# Patient Record
Sex: Female | Born: 1945
Health system: Southern US, Community
[De-identification: ages and names within clinical notes are randomized; demographics above are authoritative.]

## PROBLEM LIST (undated history)

## (undated) DIAGNOSIS — I10 Essential (primary) hypertension: Secondary | ICD-10-CM

## (undated) DIAGNOSIS — R51 Headache: Secondary | ICD-10-CM

## (undated) DIAGNOSIS — Z9889 Other specified postprocedural states: Secondary | ICD-10-CM

## (undated) DIAGNOSIS — S329XXA Fracture of unspecified parts of lumbosacral spine and pelvis, initial encounter for closed fracture: Secondary | ICD-10-CM

## (undated) DIAGNOSIS — K219 Gastro-esophageal reflux disease without esophagitis: Secondary | ICD-10-CM

## (undated) DIAGNOSIS — R112 Nausea with vomiting, unspecified: Secondary | ICD-10-CM

## (undated) DIAGNOSIS — C801 Malignant (primary) neoplasm, unspecified: Secondary | ICD-10-CM

## (undated) DIAGNOSIS — E7800 Pure hypercholesterolemia, unspecified: Secondary | ICD-10-CM

## (undated) DIAGNOSIS — E079 Disorder of thyroid, unspecified: Secondary | ICD-10-CM

## (undated) DIAGNOSIS — E039 Hypothyroidism, unspecified: Secondary | ICD-10-CM

## (undated) DIAGNOSIS — H269 Unspecified cataract: Secondary | ICD-10-CM

## (undated) HISTORY — DX: Essential (primary) hypertension: I10

## (undated) HISTORY — DX: Fracture of unspecified parts of lumbosacral spine and pelvis, initial encounter for closed fracture: S32.9XXA

## (undated) HISTORY — DX: Disorder of thyroid, unspecified: E07.9

## (undated) HISTORY — PX: CATARACT EXTRACTION: SUR2

## (undated) HISTORY — DX: Pure hypercholesterolemia, unspecified: E78.00

## (undated) HISTORY — DX: Gastro-esophageal reflux disease without esophagitis: K21.9

## (undated) HISTORY — PX: LAPAROSCOPIC CHOLECYSTECTOMY: SUR755

---
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
## 1998-06-25 ENCOUNTER — Emergency Department (HOSPITAL_COMMUNITY): Admission: EM | Admit: 1998-06-25 | Discharge: 1998-06-25 | Payer: Self-pay | Admitting: Emergency Medicine

## 1998-06-25 ENCOUNTER — Encounter: Payer: Self-pay | Admitting: Emergency Medicine

## 2000-01-22 ENCOUNTER — Other Ambulatory Visit: Admission: RE | Admit: 2000-01-22 | Discharge: 2000-01-22 | Payer: Self-pay | Admitting: Obstetrics and Gynecology

## 2000-01-26 ENCOUNTER — Encounter: Payer: Self-pay | Admitting: Endocrinology

## 2000-01-26 ENCOUNTER — Ambulatory Visit (HOSPITAL_COMMUNITY): Admission: RE | Admit: 2000-01-26 | Discharge: 2000-01-26 | Payer: Self-pay | Admitting: Endocrinology

## 2000-01-30 ENCOUNTER — Other Ambulatory Visit: Admission: RE | Admit: 2000-01-30 | Discharge: 2000-01-30 | Payer: Self-pay | Admitting: Obstetrics and Gynecology

## 2000-01-30 ENCOUNTER — Encounter (INDEPENDENT_AMBULATORY_CARE_PROVIDER_SITE_OTHER): Payer: Self-pay | Admitting: Specialist

## 2000-02-02 ENCOUNTER — Encounter: Payer: Self-pay | Admitting: *Deleted

## 2000-02-02 ENCOUNTER — Ambulatory Visit (HOSPITAL_COMMUNITY): Admission: RE | Admit: 2000-02-02 | Discharge: 2000-02-02 | Payer: Self-pay | Admitting: *Deleted

## 2000-02-09 ENCOUNTER — Ambulatory Visit (HOSPITAL_COMMUNITY): Admission: RE | Admit: 2000-02-09 | Discharge: 2000-02-09 | Payer: Self-pay | Admitting: *Deleted

## 2000-02-09 ENCOUNTER — Encounter: Payer: Self-pay | Admitting: *Deleted

## 2000-02-12 ENCOUNTER — Encounter: Payer: Self-pay | Admitting: *Deleted

## 2000-02-12 ENCOUNTER — Ambulatory Visit (HOSPITAL_COMMUNITY): Admission: RE | Admit: 2000-02-12 | Discharge: 2000-02-12 | Payer: Self-pay | Admitting: *Deleted

## 2000-02-21 ENCOUNTER — Encounter (INDEPENDENT_AMBULATORY_CARE_PROVIDER_SITE_OTHER): Payer: Self-pay | Admitting: Specialist

## 2000-02-21 ENCOUNTER — Observation Stay (HOSPITAL_COMMUNITY): Admission: RE | Admit: 2000-02-21 | Discharge: 2000-02-22 | Payer: Self-pay | Admitting: *Deleted

## 2000-07-15 ENCOUNTER — Ambulatory Visit (HOSPITAL_COMMUNITY): Admission: RE | Admit: 2000-07-15 | Discharge: 2000-07-15 | Payer: Self-pay | Admitting: *Deleted

## 2000-07-15 ENCOUNTER — Encounter (INDEPENDENT_AMBULATORY_CARE_PROVIDER_SITE_OTHER): Payer: Self-pay

## 2000-08-28 ENCOUNTER — Emergency Department (HOSPITAL_COMMUNITY): Admission: EM | Admit: 2000-08-28 | Discharge: 2000-08-28 | Payer: Self-pay | Admitting: *Deleted

## 2001-01-23 ENCOUNTER — Other Ambulatory Visit: Admission: RE | Admit: 2001-01-23 | Discharge: 2001-01-23 | Payer: Self-pay | Admitting: Obstetrics and Gynecology

## 2001-04-14 ENCOUNTER — Emergency Department (HOSPITAL_COMMUNITY): Admission: EM | Admit: 2001-04-14 | Discharge: 2001-04-14 | Payer: Self-pay | Admitting: Emergency Medicine

## 2001-07-21 ENCOUNTER — Encounter: Payer: Self-pay | Admitting: Endocrinology

## 2001-07-21 ENCOUNTER — Encounter: Admission: RE | Admit: 2001-07-21 | Discharge: 2001-07-21 | Payer: Self-pay | Admitting: Endocrinology

## 2003-03-05 ENCOUNTER — Emergency Department (HOSPITAL_COMMUNITY): Admission: EM | Admit: 2003-03-05 | Discharge: 2003-03-05 | Payer: Self-pay | Admitting: Emergency Medicine

## 2003-06-29 ENCOUNTER — Encounter (HOSPITAL_COMMUNITY): Admission: RE | Admit: 2003-06-29 | Discharge: 2003-09-27 | Payer: Self-pay | Admitting: Cardiology

## 2003-08-06 ENCOUNTER — Encounter: Admission: RE | Admit: 2003-08-06 | Discharge: 2003-08-06 | Payer: Self-pay | Admitting: Gastroenterology

## 2003-09-22 ENCOUNTER — Ambulatory Visit (HOSPITAL_COMMUNITY): Admission: RE | Admit: 2003-09-22 | Discharge: 2003-09-22 | Payer: Self-pay | Admitting: Gastroenterology

## 2005-08-21 ENCOUNTER — Encounter: Admission: RE | Admit: 2005-08-21 | Discharge: 2005-08-21 | Payer: Self-pay | Admitting: *Deleted

## 2005-10-12 ENCOUNTER — Encounter: Admission: RE | Admit: 2005-10-12 | Discharge: 2005-10-12 | Payer: Self-pay | Admitting: Gastroenterology

## 2005-10-17 ENCOUNTER — Ambulatory Visit (HOSPITAL_COMMUNITY): Admission: RE | Admit: 2005-10-17 | Discharge: 2005-10-17 | Payer: Self-pay | Admitting: Gastroenterology

## 2005-10-22 ENCOUNTER — Encounter: Admission: RE | Admit: 2005-10-22 | Discharge: 2005-10-22 | Payer: Self-pay | Admitting: Gastroenterology

## 2005-10-31 ENCOUNTER — Encounter: Admission: RE | Admit: 2005-10-31 | Discharge: 2005-10-31 | Payer: Self-pay | Admitting: Gastroenterology

## 2006-10-09 ENCOUNTER — Encounter: Admission: RE | Admit: 2006-10-09 | Discharge: 2006-10-09 | Payer: Self-pay | Admitting: Endocrinology

## 2007-03-27 HISTORY — PX: OTHER SURGICAL HISTORY: SHX169

## 2007-07-01 ENCOUNTER — Other Ambulatory Visit: Admission: RE | Admit: 2007-07-01 | Discharge: 2007-07-01 | Payer: Self-pay | Admitting: Internal Medicine

## 2007-07-02 ENCOUNTER — Encounter: Admission: RE | Admit: 2007-07-02 | Discharge: 2007-07-02 | Payer: Self-pay | Admitting: Endocrinology

## 2007-08-12 ENCOUNTER — Encounter: Admission: RE | Admit: 2007-08-12 | Discharge: 2007-11-10 | Payer: Self-pay | Admitting: Specialist

## 2007-11-25 ENCOUNTER — Encounter: Admission: RE | Admit: 2007-11-25 | Discharge: 2007-12-18 | Payer: Self-pay | Admitting: Specialist

## 2008-01-15 ENCOUNTER — Encounter: Admission: RE | Admit: 2008-01-15 | Discharge: 2008-02-23 | Payer: Self-pay | Admitting: Specialist

## 2008-02-26 ENCOUNTER — Encounter: Admission: RE | Admit: 2008-02-26 | Discharge: 2008-04-01 | Payer: Self-pay | Admitting: Specialist

## 2010-08-11 NOTE — Op Note (Signed)
NAME:  Shannon Gardner, Shannon Gardner                    ACCOUNT NO.:  [id number]   MEDICAL RECORD NO.:  [id number]                   PATIENT TYPE:  AMB   LOCATION:  ENDO                                 FACILITY:  [facility]   PHYSICIAN:  Anselmo Rod, M.D.               DATE OF BIRTH:  Jul 06, 1945   DATE OF PROCEDURE:  DATE OF DISCHARGE:                                 OPERATIVE REPORT   DATE OF PROCEDURE:  September 22, 2003.   PROCEDURE PERFORMED:  Screening colonoscopy.   ENDOSCOPIST:  Anselmo Rod, MD   INSTRUMENT USED:  Olympus video colonoscope.   INDICATIONS FOR PROCEDURE:  Gardner 65 year old white female with Gardner history of  abdominal pain and occasional __________ undergoing Gardner screening colonoscopy  to rule out colonic polyps, masses, etc.   PRE-PROCEDURE PREPARATION:  Informed consent was procured from the patient.  The patient was fasted for 8 hours prior to the procedure and prepped with Gardner  bottle of magnesium citrate and Gardner gallon of GoLYTELY the night prior to the  procedure.   PRE-PROCEDURE PHYSICAL:  VITAL SIGNS:  The patient had stable vital signs.  NECK:  Supple.  CHEST:  Clear to auscultation.  HEART:  S1 and S2 regular.  ABDOMEN:  Soft with normal bowel sounds.   DESCRIPTION OF PROCEDURE:  The patient was placed in the left lateral  decubitus position and sedated with 75 mg of Demerol and 5 mg of Versed  intravenously in slow incremental doses.  Once the patient was adequately  sedated and maintained on low-flow oxygen and continuous cardiac monitoring,  the Olympus video colonoscope was advanced into the right tip of the cecum.  There was some residual stool in the colon and multiple washings were done,  as well as internal hemorrhoids were seen on retroflexion in the rectum.  There was evidence of Gardner few, scattered, sigmoid diverticula.  No masses or  polyps were identified.  The procedure was completed up to the cecum, and  the appendiceal orifice and ileocecal valve were  clearly visualized  and  photographed.  The patient tolerated the procedure well without any  immediate complications.   IMPRESSION:  1. Small, nonbleeding, internal hemorrhoids.  2. Gardner few large, scattered, sigmoid diverticula.  3. No masses or polyps seen.   RECOMMENDATIONS:  1. Continue Gardner high fiber diet with liberal fluid intake.  2. Repeat colonoscopy in the next 10 years unless the patient develops any     abnormal symptoms in the interim.  3. Outpatient followup in the next 2 weeks to further work up her abdominal     pain.                                               Anselmo Rod, M.D.    JNM/MEDQ  D:  09/22/2003  T:  09/22/2003  Job:  [id number]   cc:   Cliffton Asters ???, Dr.  [facility]

## 2010-08-11 NOTE — Procedures (Signed)
[facility]  Patient:    Shannon Gardner, Shannon Gardner                 MRN: [id number] Proc. Date: 07/15/00 Adm. Date:  [id number] Attending:  Sabino Gasser                           Procedure Report  PROCEDURE:  Upper endoscopy.  INDICATION FOR PROCEDURE:  Abdominal pain.  ANESTHESIA:  Demerol 90, Versed 9 mg.  DESCRIPTION OF PROCEDURE:  With the patient mildly sedated in the left lateral decubitus position, the Olympus videoscopic endoscope was inserted in the mouth and passed under direct vision through the esophagus which showed changes of questionable Barretts photographed and biopsied. We entered into the stomach. The fundus, body, antrum, and duodenal bulb were all well visualized as was the second portion of the duodenum. Photographs taken. From this point, the endoscope was slowly withdrawn taking circumferential views of the entire duodenal mucosa until the endoscope was then pulled back into the stomach, placed in retroflexion to view the stomach from below and this too was photographed. The endoscope was then straightened and withdrawn taking circumferential views of the entire gastric mucosa stomal stopping to photograph and take CLO biopsy specimens along the way. The endoscope was withdrawn taking circumferential views of the remaining esophageal mucosa. The patients vital signs and pulse oximeter remained stable. The patient tolerated the procedure well and there were no apparent complications.  FINDINGS:  Question of Barretts esophagus, H. pylori biopsy taken as well as biopsy to rule out Barretts.  PLAN:  Have the patient call me for results of biopsy and followup with me in as an outpatient. DD:  07/15/00 TD:  07/16/00 Job: [id number]

## 2012-08-11 ENCOUNTER — Ambulatory Visit
Admission: RE | Admit: 2012-08-11 | Discharge: 2012-08-11 | Disposition: A | Discharge status: Home / Self Care | Payer: Medicare Other | Source: Ambulatory Visit | Attending: Endocrinology | Admitting: Endocrinology

## 2012-08-11 ENCOUNTER — Other Ambulatory Visit: Payer: Self-pay | Admitting: Endocrinology

## 2012-08-11 DIAGNOSIS — M79604 Pain in right leg: Secondary | ICD-10-CM

## 2012-10-30 ENCOUNTER — Other Ambulatory Visit: Payer: Self-pay | Admitting: Gastroenterology

## 2012-10-30 DIAGNOSIS — R1013 Epigastric pain: Secondary | ICD-10-CM

## 2012-10-30 DIAGNOSIS — R1033 Periumbilical pain: Secondary | ICD-10-CM

## 2012-11-04 ENCOUNTER — Ambulatory Visit
Admission: RE | Admit: 2012-11-04 | Discharge: 2012-11-04 | Disposition: A | Discharge status: Home / Self Care | Payer: Medicare Other | Source: Ambulatory Visit | Attending: Gastroenterology | Admitting: Gastroenterology

## 2012-11-04 DIAGNOSIS — R1033 Periumbilical pain: Secondary | ICD-10-CM

## 2012-11-04 DIAGNOSIS — R1013 Epigastric pain: Secondary | ICD-10-CM

## 2012-11-04 MED ORDER — IOHEXOL 300 MG/ML  SOLN
125.0000 mL | Freq: Once | INTRAMUSCULAR | Status: AC | PRN
  Administered 2012-11-04: 125 mL via INTRAVENOUS

## 2012-11-21 ENCOUNTER — Other Ambulatory Visit (HOSPITAL_COMMUNITY): Payer: Self-pay | Admitting: Urology

## 2012-11-21 DIAGNOSIS — R310 Gross hematuria: Secondary | ICD-10-CM

## 2012-12-08 ENCOUNTER — Ambulatory Visit (HOSPITAL_COMMUNITY): Admission: RE | Admit: 2012-12-08 | Payer: Medicare Other | Source: Ambulatory Visit

## 2012-12-08 ENCOUNTER — Ambulatory Visit (HOSPITAL_COMMUNITY)
Admission: RE | Admit: 2012-12-08 | Discharge: 2012-12-08 | Disposition: A | Discharge status: Home / Self Care | Payer: Medicare Other | Source: Ambulatory Visit | Attending: Urology | Admitting: Urology

## 2012-12-08 DIAGNOSIS — R310 Gross hematuria: Secondary | ICD-10-CM

## 2012-12-08 DIAGNOSIS — N281 Cyst of kidney, acquired: Secondary | ICD-10-CM | POA: Insufficient documentation

## 2012-12-08 DIAGNOSIS — R948 Abnormal results of function studies of other organs and systems: Secondary | ICD-10-CM | POA: Insufficient documentation

## 2012-12-08 MED ORDER — GADOBENATE DIMEGLUMINE 529 MG/ML IV SOLN
20.0000 mL | Freq: Once | INTRAVENOUS | Status: AC | PRN
  Administered 2012-12-08: 20 mL via INTRAVENOUS

## 2012-12-30 ENCOUNTER — Ambulatory Visit: Payer: Medicare Other | Attending: Gynecologic Oncology | Admitting: Gynecologic Oncology

## 2012-12-30 ENCOUNTER — Encounter: Payer: Self-pay | Admitting: Gynecologic Oncology

## 2012-12-30 VITALS — BP 150/77 | HR 66 | Temp 97.8°F | Resp 18 | Ht 65.16 in | Wt 291.4 lb

## 2012-12-30 DIAGNOSIS — C541 Malignant neoplasm of endometrium: Secondary | ICD-10-CM | POA: Insufficient documentation

## 2012-12-30 DIAGNOSIS — E669 Obesity, unspecified: Secondary | ICD-10-CM | POA: Insufficient documentation

## 2012-12-30 DIAGNOSIS — C549 Malignant neoplasm of corpus uteri, unspecified: Secondary | ICD-10-CM | POA: Insufficient documentation

## 2012-12-30 DIAGNOSIS — Z6841 Body Mass Index (BMI) 40.0 and over, adult: Secondary | ICD-10-CM | POA: Insufficient documentation

## 2012-12-30 DIAGNOSIS — E079 Disorder of thyroid, unspecified: Secondary | ICD-10-CM | POA: Insufficient documentation

## 2012-12-30 DIAGNOSIS — I10 Essential (primary) hypertension: Secondary | ICD-10-CM | POA: Insufficient documentation

## 2012-12-30 DIAGNOSIS — K219 Gastro-esophageal reflux disease without esophagitis: Secondary | ICD-10-CM | POA: Insufficient documentation

## 2012-12-30 DIAGNOSIS — Z79899 Other long term (current) drug therapy: Secondary | ICD-10-CM | POA: Insufficient documentation

## 2012-12-30 NOTE — Progress Notes (Signed)
Consult Note: Gyn-Onc   Consult was requested by Dr. LoVoie for the evaluation of Shannon Gardner 66 y.o. female  CC:  Chief Complaint  Patient presents with  . Endo CA    New Consult    Assessment/Plan:  [name]  is a 66 y.o.  year old with a FIGO grade 1 endometrial adenocarcinoma and a BMI of approximately 48.  Uterine size could not be assessed on physical examination or on the imaging available.  The staging options discussed with the patient were minimal or open hysterectomy bilateral salpingo-oophorectomy and lymph node dissection.  On physical examination she lays well and significant amount of her obesity is in the buttocks and back.  I discussed the benefits of minimally invasive endometrial cancer staging the patient and her husband. I extensively reviewed the risks of robotic hysterectomy and possible lymph node dissection of infection, bleeding, damage to nearby organs including bowel, bladder, vessels, nerves, and ureters. We discussed postoperative risks including infection, PE/ DVT, and lymphedema. We reviewed possible need for conversion to open laparotomy, need for possible blood transfusion. I discussed positioning during surgery of trendelenberg and risks of minor facial swelling and care we take in preoperative positioning. We also reviewed possible need for further treatment such as radiation or chemotherapy based on final pathology. Expected postoperative recovery was also discussed. The plan is for robotic assisted laparoscopic hysterectomy bilateral salpingo-oophorectomy possible pelvic lymph node dissection on 01/20/2013 with Dr. Paola Gehrig.  All of her questions were answered her satisfaction   HPI: [name]  is a 66 y.o. gravida 4 para 4 last normal menstrual period in her early 50s. The patient presented with reports of hematuria specifically bloody mucous discharge within her urine over the last 9 months. A workup was undertaken  and that included an MRI which demonstrated a thickened irregular endometrium in the fundus measuring up to 2 cm diverticulosis of the colon was noted the adnexa appeared normal. The rectal remnant was noted that there were no findings of Eure, bladder cancer. Abdominal MRI was unremarkable and a CT scan did not add anything additional. She underwent cystoscopy and that was within normal limits. Underwent an endometrial biopsy on 12/18/2012 which demonstrated a FIGO grade 1 endometrial adenocarcinoma. His Pap negative HPV negative. A colonoscopy on August of 2014 was notable for the presence of a benign polyp.  The patient feels well denies any weight loss denies any changes in her bladder or rectal habits reports occasional bleeding denies any shortness of breath.   Current Meds:  Outpatient Encounter Prescriptions as of 12/30/2012  Medication Sig Dispense Refill  . acetaminophen (TYLENOL) 325 MG tablet Take 650 mg by mouth every 6 (six) hours as needed for pain.      . aspirin 81 MG tablet Take 81 mg by mouth daily.      . BIOGAIA PROBIOTIC (BIOGAIA PROBIOTIC) LIQD Take by mouth daily at 8 pm. OTC probiotic taken " at night"  Align.      . ezetimibe (ZETIA) 10 MG tablet Take 10 mg by mouth at bedtime.      . ibuprofen (ADVIL,MOTRIN) 200 MG tablet Take 100 mg by mouth every 6 (six) hours as needed for pain.      . levothyroxine (SYNTHROID, LEVOTHROID) 112 MCG tablet Take 112 mcg by mouth daily before breakfast.      . losartan-hydrochlorothiazide (HYZAAR) 50-12.5 MG per tablet Take 1 tablet by mouth 2 (two) times daily. One tablet in am,   and one tablet in pm.      . omeprazole (PRILOSEC) 40 MG capsule Take 40 mg by mouth 2 (two) times daily. Am and pm      . rosuvastatin (CRESTOR) 5 MG tablet Take 2.5 mg by mouth every other day.       No facility-administered encounter medications on file as of 12/30/2012.    Allergy:  Allergies  Allergen Reactions  . Altace [Ramipril]     Social Hx:    History   Social History  . Marital Status: Married    Spouse Name: N/A    Number of Children: N/A  . Years of Education: N/A   Occupational History  . Not on file.   Social History Main Topics  . Smoking status: Former Smoker    Start date: 03/26/1974    Quit date: 03/26/1988  . Smokeless tobacco: Not on file  . Alcohol Use: 0.0 oz/week     Comment: " 3-4 times a year"   . Drug Use: No  . Sexual Activity: Not Currently   Other Topics Concern  . Not on file   Social History Narrative  . No narrative on file    Past Surgical Hx:  Past Surgical History  Procedure Laterality Date  . Laparoscopic cholecystectomy  2000?  . Left knee surg  2009    "Torn meniscus"   . Tonsilectomy, adenoidectomy, bilateral myringotomy and tubes  1955    Past Medical Hx:  Past Medical History  Diagnosis Date  . Hypertension   . Thyroid disease   . GERD (gastroesophageal reflux disease)   . Blood transfusion without reported diagnosis    diverticulosis  Past Gynecological History: Gravida 4 para 4 last normal menstrual period in her early 50s menarche at age 11 with regular menses reports oral contraceptive pill use intermittently over 10 years   No LMP recorded. Patient is postmenopausal. Denies any history of abnormal Pap test NSVD x4  Family Hx:  Family History  Problem Relation Age of Onset  . Cancer Mother   . Cancer Sister   . Cancer Brother    her mother was diagnosed leiomyosarcoma presumed of lung origin at age of 68. Sister diagnosed with lung cancer the age of 47 brother diagnosed with prostate cancer in his 50s  Review of Systems:  Constitutional  Feels well,  denies recent weight loss Cardiovascular  No chest pain, shortness of breath, or edema  Pulmonary  No cough or wheeze.  Gastro Intestinal  No nausea, vomitting, or diarrhoea. No bright red blood per rectum, no abdominal pain, change in bowel movement, or constipation.  Genito Urinary  No frequency,  urgency, dysuria, and omentum vaginal bleeding no vaginal discharge  Musculo Skeletal  No myalgia, arthralgia, occasional arthritic pain Neurologic  No weakness, numbness, change in gait,  Psychology  No depression, anxiety, insomnia.   Vitals:  Blood pressure 150/77, pulse 66, temperature 97.8 F (36.6 C), temperature source Oral, resp. rate 18, height 5' 5.16" (1.655 m), weight 291 lb 6.4 oz (132.178 kg). Body mass index is 48.26 kg/(m^2).  Physical Exam: WD in NAD Neck  Supple NROM, without any enlargements.  Lymph Node Survey No cervical supraclavicular or inguinal adenopathy Cardiovascular  Pulse normal rate, regularity and rhythm. S1 and S2 normal.  Lungs  Clear to auscultation bilateraly, without wheezes/crackles/rhonchi. Good air movement.  Psychiatry  Alert and oriented to person, place, and time appropriate speech mood and affect Abdomen  Normoactive bowel sounds, abdomen soft, non-tender and obese.    Back No CVA tenderness Genito Urinary  Vulva/vagina: Normal external female genitalia.  No lesions. No discharge or bleeding.  Bladder/urethra:  No lesions or masses  Vagina: Atrophic no blood or discharge in the vault  Cervix: Normal appearing, no lesions. Cervical size approximately 3 cm  Uterus: Well supported  mobile, no parametrial involvement or nodularity. Unable to assess uterine size because of body habitus  Adnexa: unable to assess Rectal  Good tone, no masses no cul de sac nodularity.  Extremities  No bilateral cyanosis, clubbing or edema.   Jaclyn Andy, MD, PhD 12/30/2012, 1:10 PM    

## 2012-12-30 NOTE — Patient Instructions (Signed)
Will perform robotic hysterectomy and lymph nodes with Dr. Cleda Mccreedy on 01/20/2013.  Cancer of the Uterus The uterus is part of a woman's reproductive system. It is the hollow, pear-shaped organ where a baby grows. The uterus is in the pelvis between the bladder and the rectum. The narrow, lower portion of the uterus is the cervix. The fallopian tubes extend from either side of the top of the uterus to the ovaries. The wall of the uterus has two layers of tissue. The inner layer, or lining, is the endometrium. The outer layer is muscle tissue called the myometrium. In women of childbearing age, the lining of the uterus grows and thickens each month to prepare for pregnancy. If a woman does not become pregnant, the thick, bloody lining flows out of the body through the vagina. This flow is called menstruation. TYPES OF UTERINE CANCER  The most common type of cancer of the uterus begins in the lining (endometrium). It is called endometrial cancer, uterine cancer, or cancer of the uterus. It is seen in 2% to 3% of women.  A different type of cancer, uterine sarcoma, develops in the muscle (myometrium). Cancer that begins in the cervix is also a different type of cancer.  Rarely, a noncancerous fibroid tumor of the uterus develops into a sarcoma. CAUSES  No one knows the exact causes of uterine cancer. But it is clear that this disease is not contagious. No one can "catch" cancer from another person. Women who get this disease are more likely than other women to have certain risk factors. A risk factor is something that increases a person's chance of developing the disease.  Most women who have known risk factors do not get uterine cancer. On the other hand, many who do get this disease have none of these factors. Doctors can seldom explain why one woman gets uterine cancer and another does not.  Studies have found the following risk factors:  Age. Cancer of the uterus occurs mostly in women over  age 32.  Endometrial hyperplasia (enlarged endometrium). The risk of uterine cancer is higher if a woman has endometrial hyperplasia.  Hormone replacement therapy (HRT). HRT is used to control the symptoms of menopause, to prevent osteoporosis (thinning of the bones), and to reduce the risk of heart disease or stroke. Women who still have their uterus, and use estrogen without progesterone, have an increased risk of uterine cancer. Long-term use and large doses of estrogen seem to increase this risk. Women who use a combination of estrogen and progesterone have a lower risk of uterine cancer than women who use estrogen alone. The progesterone protects the uterus from developing cancer.  Obesity and related conditions. The body stores and releases some of its estrogen in fatty tissue. That is why obese women are more likely than thin women to have higher levels of estrogen in their bodies. High levels of estrogen may be the reason that obese women have an increased risk of developing uterine cancer. The risk of this disease is also higher in women with diabetes or high blood pressure. These conditions occur in many obese women.  Tamoxifen. Women taking the drug tamoxifen to prevent or treat breast cancer have an increased risk of uterine cancer. This risk appears to be related to the estrogen-like effect of this drug on the uterus.  Race. White women are more likely than African-American women to get uterine cancer.  Colorectal cancer. Women who have had an inherited form of colorectal cancer have a  higher risk of developing uterine cancer than other women.  Infertility.  Beginning menstrual periods before age 38.  Having menstrual periods after age 41.  History of cancer of the ovary or intestine.  Family history of uterine cancer.  Having diabetes, high blood pressure, thyroid or gallbladder disease.  Long-term use of high does of birth control pills. Birth control pills today are low in  hormone doses.  Radiation to the abdomen or pelvis.  Smoking. SYMPTOMS  Uterine cancer usually occurs after menopause. But it may also occur around the time that menopause begins. Abnormal vaginal bleeding is the most common symptom of uterine cancer. Bleeding may start as a watery, blood-streaked flow that gradually contains more blood. Women should not assume that abnormal vaginal bleeding is part of menopause. A woman should see her caregiver if she has any of the following symptoms:  Unusual vaginal bleeding or discharge.  Difficult or painful urination.  Pain during intercourse.  Pain in the pelvic area.  Increased girth (growth) of the stomach.  Any vaginal bleeding after menopause.  Unexplained weight loss. These symptoms can be caused by cancer or other less serious conditions. Most often they are not cancer. But a thorough evaluation is needed to be certain. DIAGNOSIS  If a woman has symptoms that suggest uterine cancer, her caregiver may check her general health and may order blood and urine tests. The caregiver also may perform one or more of these exams or tests.  Blood and urine tests and chest x-rays. The woman also may have:  Other X-rays.  CT scans.  Ultrasound test.  Magnetic resonance imaging (MRI).  Sigmoidoscopy.  Colonoscopy.  Pelvic exam. A woman will have a pelvic exam to check the vagina, uterus, bladder, and rectum. The caregiver feels these organs for any lumps or changes in their shape or size. To see the upper part of the vagina and the cervix, the caregiver inserts an instrument called a speculum into the vagina.  Pap test. The caregiver collects cells from the cervix and upper vagina. A medical laboratory checks for abnormal cells. The Pap test is better for detecting cancer of the cervix. But cells from inside the uterus usually do not show up on a Pap test. It is not a reliable test for uterine cancer.  Transvaginal ultrasound. The medical  caregiver inserts an instrument into the vagina. The instrument aims high-frequency sound waves at the uterus. The pattern of the echoes they produce creates a picture. If the endometrium looks too thick, the caregiver can do a biopsy.  Biopsy. The medical caregiver removes a sample of tissue from the uterine lining. This usually can be done in the caregiver's office.  Dilatation and Curettage (D&C). In some cases, a woman may need to have a D&C. D&C is usually done as same-day surgery with anesthesia in a hospital. A pathologist examines the tissue (lining of the uterus) to check for cancer cells and other conditions. STAGING   If uterine cancer is diagnosed, the caregiver needs to know the stage, or extent, of the disease to plan the best treatment. Staging is a careful attempt to find out whether the cancer has spread, and if so, to what parts of the body.  When uterine cancer spreads (metastasizes) outside the uterus, cancer cells are often found in nearby lymph nodes, nerves, or blood vessels. If the cancer has reached the lymph nodes, cancer cells may have spread to other lymph nodes and other organs of the body.  Staging is done  at the time of surgery. In most cases, the most reliable way to stage this disease is to remove the uterus, cervix, tubes, ovaries, and lymph nodes. A pathologist uses a microscope to examine the uterus and other tissues removed by the surgeon, to determine the extent of the cancer in the pelvis.  If lymph nodes have cancer cells, other parts of the body are examined, to see if it has spread to other organs. MAIN FEATURES OF EACH STAGE OF THE DISEASE: Stage I. The cancer is only in the body of the uterus. It is not in the cervix. Stage II. The cancer has spread from the body of the uterus to the cervix. Stage III. The cancer has spread outside the uterus, but not outside the pelvis (and not to the bladder or rectum). Lymph nodes in the pelvis may contain cancer  cells. Stage IV. The cancer has spread into the bladder or rectum. It may have spread beyond the pelvis to other body parts. TREATMENT  Women with uterine cancer have many treatment options. Most women with uterine cancer are treated with surgery. Some have radiation or chemotherapy. A smaller number of women may be treated with hormonal therapy. Some patients receive a combination of therapies. You may want to consult with another cancer doctor for a second opinion. The caregiver (usually a cancer doctor) is the best person to describe your treatment choices and to discuss the expected results of treatment. SURGERY  Most women with uterine cancer have surgery to remove the uterus, cervix, tubes, and ovaries (total hysterectomy). This is usually done through an incision in the abdomen.  The doctor may also remove the lymph nodes near the tumor, to see if they contain cancer. If cancer cells have reached the lymph nodes, it may mean that the disease has spread to other parts of the body. If cancer cells have not spread beyond the endometrium, the woman may not need to have any other treatment. The length of the hospital stay may vary from several days to a week. RADIATION THERAPY  In radiation therapy, high-energy rays are used to kill cancer cells. Like surgery, radiation therapy is a local therapy. It affects cancer cells only in the treated area.  Some women with Stage I, II, or III uterine cancer need both radiation therapy and surgery. They may have radiation before surgery to shrink the tumor, or after surgery to destroy any cancer cells that remain in the area. The doctor may suggest radiation treatments for the small number of women who cannot have surgery.  Doctors use two types of radiation therapy to treat uterine cancer:  External radiation. In external radiation therapy, a large machine outside the body is used to aim radiation at the tumor area. The woman usually does not stay overnight  (outpatient) at the hospital or clinic, and receives external radiation 5 days a week for several weeks. This schedule helps protect healthy cells and tissue by spreading out the total dose of radiation. No radioactive materials are put into the body for external radiation therapy.  Internal radiation. In internal radiation therapy, tiny tubes containing a radioactive substance are inserted through the vagina and cervix, into the uterus, and left in place for a few days. The woman stays in the hospital during this treatment. To protect others from radiation exposure, the patient may not be able to have visitors or may have visitors only for a short period of time while the implant is in place. Once the implant is  removed, the woman has no radioactivity in her body.  Some patients need both external and internal radiation therapies. CHEMOTHERAPY Chemotherapy is not usually used for endometrial cancer of the uterus. However, with sarcoma of the uterus or of the fibroid, it may be used in combination with surgery. Chemotherapy may also be used with recurring sarcoma, and in patients who cannot have surgery. HORMONE THERAPY Hormonal therapy involves substances that prevent cancer cells from multiplying or growing by attaching to hormone receptors. This causes changes in cancer cells. Before therapy begins, the caregiver may request a hormone receptor test. This special lab test of uterine tissue helps the caregiver learn if estrogen and progesterone receptors are present. If the tissue has receptors, the woman is more likely to respond to hormonal therapy.  Hormonal therapy is called a systemic therapy, because it can affect cancer cells throughout the body. Usually, hormonal therapy is a type of progesterone, taken as a pill or injection.  The doctor may use hormonal therapy for women with uterine cancer who are unable to have surgery or radiation therapy. Also, the doctor may give hormonal therapy to women  with uterine cancer that has spread to the lungs or other distant sites. It is also given to women with uterine cancer that has come back.  Hormonal therapy can cause a number of side effects. Women taking progesterone may retain fluid, have an increased appetite, and gain weight. Women who are still menstruating may have changes in their periods.  Hormone therapy can be used in combination with surgery or radiation. HOME CARE INSTRUCTIONS   Maintain a normal weight with a healthy balanced diet and exercise.  If you have diabetes, high blood pressure, thyroid or gallbladder disease, keep them in control with your caregiver's treatment and recommendations.  Do not smoke.  Do not take estrogen without taking progesterone with it, for menopausal symptoms.  Join a support group or get counseling, if you would like help dealing with your cancer.  If you are on hormone replacement therapy, see your caregiver as recommended, and be informed about the side effects of HRT.  Women with known risk factors should ask their caregiver what symptoms to look for and how often they should have an examination.  Keep your follow-up appointments and take your medicines as advised.  Write your questions down, and take them with you to your caregiver's appointments.  You may want another person to be with you for your appointments, so you do not miss any instructions. SEEK MEDICAL CARE IF:   You have any abnormal vaginal bleeding.  You are having menstrual periods at the age of 13 or older.  You have bleeding after sexual intercourse.  You are taking tomoxifen and develop vaginal bleeding.  Your stomach is growing, and you are not pregnant.  You have pain with sexual intercourse.  You have stomach or pelvis pain.  You have weight loss for no known reason.  You have pain or difficulty with urination. [facility] B[facility]  Cancer Information Service (CIS) provides accurate,  up-to-date information on cancer to patients and their families, health professionals, and the general public:  Phone: 1-800-4-CANCER (520-549-7195).  Internet: http://www.cancer.gov NCI's website contains complete information about cancer causes and prevention, screening and diagnosis, treatment and survivorship, clinical trials, statistics, funding, training, and employment opportunities, and [employer] and its programs. CLINICAL TRIALS A woman who is interested in being part of a clinical trial should talk with her caregiver. NCI's website (http://www.johnson-fowler.biz/) provides general information  about clinical trials. It also offers detailed information about specific ongoing studies of uterine cancer by linking to PDQ, a cancer information database developed by the NCI. The Cancer Information Service at 1-800-4-CANCER can answer questions about cancer and provide information from the PDQ database. Document Released: 03/12/2005 Document Revised: 06/04/2011 Document Reviewed: 01/13/2009 [facility] Patient Information 2014 [place], [place].

## 2013-01-13 ENCOUNTER — Encounter (HOSPITAL_COMMUNITY): Payer: Self-pay | Admitting: Pharmacy Technician

## 2013-01-16 ENCOUNTER — Other Ambulatory Visit: Payer: Self-pay

## 2013-01-16 ENCOUNTER — Other Ambulatory Visit: Payer: Self-pay | Admitting: Gynecologic Oncology

## 2013-01-16 ENCOUNTER — Encounter (HOSPITAL_COMMUNITY): Payer: Self-pay

## 2013-01-16 ENCOUNTER — Telehealth: Payer: Self-pay | Admitting: *Deleted

## 2013-01-16 ENCOUNTER — Encounter (HOSPITAL_COMMUNITY)
Admission: RE | Admit: 2013-01-16 | Discharge: 2013-01-16 | Disposition: A | Discharge status: Home / Self Care | Payer: Medicare Other | Source: Ambulatory Visit | Attending: Obstetrics & Gynecology | Admitting: Obstetrics & Gynecology

## 2013-01-16 DIAGNOSIS — Z01812 Encounter for preprocedural laboratory examination: Secondary | ICD-10-CM | POA: Insufficient documentation

## 2013-01-16 DIAGNOSIS — Z01810 Encounter for preprocedural cardiovascular examination: Secondary | ICD-10-CM | POA: Insufficient documentation

## 2013-01-16 DIAGNOSIS — Z01818 Encounter for other preprocedural examination: Secondary | ICD-10-CM | POA: Insufficient documentation

## 2013-01-16 DIAGNOSIS — E876 Hypokalemia: Secondary | ICD-10-CM

## 2013-01-16 HISTORY — DX: Other specified postprocedural states: R11.2

## 2013-01-16 HISTORY — DX: Headache: R51

## 2013-01-16 HISTORY — DX: Hypothyroidism, unspecified: E03.9

## 2013-01-16 HISTORY — DX: Other specified postprocedural states: Z98.890

## 2013-01-16 HISTORY — DX: Unspecified cataract: H26.9

## 2013-01-16 LAB — COMPREHENSIVE METABOLIC PANEL
ALT: 22 U/L (ref 0–35)
AST: 26 U/L (ref 0–37)
Albumin: 3.7 g/dL (ref 3.5–5.2)
Alkaline Phosphatase: 112 U/L (ref 39–117)
BUN: 18 mg/dL (ref 6–23)
CO2: 27 mEq/L (ref 19–32)
Calcium: 9.9 mg/dL (ref 8.4–10.5)
Chloride: 96 mEq/L (ref 96–112)
Creatinine, Ser: 0.86 mg/dL (ref 0.50–1.10)
GFR calc Af Amer: 79 mL/min — ABNORMAL LOW (ref >90)
GFR calc non Af Amer: 68 mL/min — ABNORMAL LOW (ref >90)
Glucose, Bld: 96 mg/dL (ref 70–99)
Potassium: 3.3 mEq/L — ABNORMAL LOW (ref 3.5–5.1)
Sodium: 134 mEq/L — ABNORMAL LOW (ref 135–145)
Total Bilirubin: 0.6 mg/dL (ref 0.3–1.2)
Total Protein: 7.7 g/dL (ref 6.0–8.3)

## 2013-01-16 LAB — CBC WITH DIFFERENTIAL/PLATELET
Basophils Absolute: 0.1 10*3/uL (ref 0.0–0.1)
Basophils Relative: 1 % (ref 0–1)
Eosinophils Absolute: 0.2 10*3/uL (ref 0.0–0.7)
Eosinophils Relative: 3 % (ref 0–5)
HCT: 44.3 % (ref 36.0–46.0)
Hemoglobin: 14.7 g/dL (ref 12.0–15.0)
Lymphocytes Relative: 27 % (ref 12–46)
Lymphs Abs: 1.9 10*3/uL (ref 0.7–4.0)
MCH: 29.8 pg (ref 26.0–34.0)
MCHC: 33.2 g/dL (ref 30.0–36.0)
MCV: 89.7 fL (ref 78.0–100.0)
Monocytes Absolute: 0.5 10*3/uL (ref 0.1–1.0)
Monocytes Relative: 7 % (ref 3–12)
Neutro Abs: 4.4 10*3/uL (ref 1.7–7.7)
Neutrophils Relative %: 62 % (ref 43–77)
Platelets: 255 10*3/uL (ref 150–400)
RBC: 4.94 MIL/uL (ref 3.87–5.11)
RDW: 13.8 % (ref 11.5–15.5)
WBC: 7.1 10*3/uL (ref 4.0–10.5)

## 2013-01-16 LAB — URINALYSIS, ROUTINE W REFLEX MICROSCOPIC
Bilirubin Urine: NEGATIVE
Glucose, UA: NEGATIVE mg/dL
Hgb urine dipstick: NEGATIVE
Ketones, ur: NEGATIVE mg/dL
Leukocytes, UA: NEGATIVE
Nitrite: NEGATIVE
Protein, ur: NEGATIVE mg/dL
Specific Gravity, Urine: 1.02 (ref 1.005–1.030)
Urobilinogen, UA: 0.2 mg/dL (ref 0.0–1.0)
pH: 6 (ref 5.0–8.0)

## 2013-01-16 MED ORDER — POTASSIUM CHLORIDE CRYS ER 20 MEQ PO TBCR: 10.0000 meq | EXTENDED_RELEASE_TABLET | Freq: Two times a day (BID) | ORAL | Status: DC

## 2013-01-16 NOTE — Progress Notes (Signed)
Patient notified of potassium level of 3.3 by Earlean Polka RN. Informed that six doses of kdur 10 meq to be taken twice daily would be sent to her pharmacy.

## 2013-01-16 NOTE — Pre-Procedure Instructions (Signed)
01-16-13 EKG done today. CXR 10-20-12-report with chart.

## 2013-01-16 NOTE — Telephone Encounter (Signed)
Spoke with patient about Potassium result of 3.3.  Notified patient that per [facility], ANP, prescription for potassium will be called in to pharmacy.

## 2013-01-16 NOTE — Patient Instructions (Addendum)
[address] [name]  01/16/2013   Your procedure is scheduled on: 10-28  -2014  Report to [facility] at        0515 AM.  Call this number if you have problems the morning of surgery: [phone]  Or Presurgical Testing [phone]([place] Hoefle)   Remember: Follow any bowel prep instructions per MD office.    Do not eat food:After Midnight.    Take these medicines the morning of surgery with A SIP OF WATER: Synthroid. Omeprazole.  Tylenol(if needed).   Do not wear jewelry, make-up or nail polish.  Do not wear lotions, powders, or perfumes. You may wear deodorant.  Do not shave 12 hours prior to first CHG shower(legs and under arms).(face and neck okay.)  Do not bring valuables to the hospital.  Contacts, dentures or removable bridgework, body piercing, hair pins may not be worn into surgery.  Leave suitcase in the car. After surgery it may be brought to your room.  For patients admitted to the hospital, checkout time is 11:00 AM the day of discharge.   Patients discharged the day of surgery will not be allowed to drive home. Must have responsible person with you x 24 hours once discharged.  Name and phone number of your driver: Mickey Farber [phone] /[phone]  cell Special Instructions: CHG(Chlorhedine 4%-"Hibiclens","Betasept","Aplicare") Shower Use Special Wash: see special instructions.(avoid face and genitals)   Please read over the following fact sheets that you were given: Blood Transfusion fact sheet, Incentive Spirometry Instruction.  Remember : Type/Screen "Blue armbands" - may not be removed once applied(would result in being retested if removed).  Failure to follow these instructions may result in Cancellation of your surgery.   Patient signature_______________________________________________________

## 2013-01-19 ENCOUNTER — Telehealth: Payer: Self-pay | Admitting: Gynecologic Oncology

## 2013-01-19 MED ORDER — DEXTROSE 5 % IV SOLN
3.0000 g | INTRAVENOUS | Status: AC
  Administered 2013-01-20: 3 g via INTRAVENOUS
  Filled 2013-01-19: qty 3000

## 2013-01-19 NOTE — Telephone Encounter (Signed)
Telephone call to check on pre-operative status.  Patient complaint with pre-operative instructions.  Reinforced NPO after midnight.  No questions or concerns voiced.  Instructed to call for any needs. 

## 2013-01-20 ENCOUNTER — Encounter (HOSPITAL_COMMUNITY): Payer: Self-pay | Admitting: *Deleted

## 2013-01-20 ENCOUNTER — Encounter (HOSPITAL_COMMUNITY)
Admission: RE | Disposition: A | Discharge status: Home / Self Care | Payer: Self-pay | Source: Ambulatory Visit | Attending: Obstetrics & Gynecology

## 2013-01-20 ENCOUNTER — Inpatient Hospital Stay (HOSPITAL_COMMUNITY): Payer: Medicare Other | Admitting: Certified Registered Nurse Anesthetist

## 2013-01-20 ENCOUNTER — Inpatient Hospital Stay (HOSPITAL_COMMUNITY)
Admission: RE | Admit: 2013-01-20 | Discharge: 2013-01-21 | DRG: 740 | Disposition: A | Discharge status: Home / Self Care | Payer: Medicare Other | Source: Ambulatory Visit | Attending: Obstetrics & Gynecology | Admitting: Obstetrics & Gynecology

## 2013-01-20 ENCOUNTER — Encounter (HOSPITAL_COMMUNITY): Payer: Medicare Other | Admitting: Certified Registered Nurse Anesthetist

## 2013-01-20 DIAGNOSIS — Z7982 Long term (current) use of aspirin: Secondary | ICD-10-CM

## 2013-01-20 DIAGNOSIS — E669 Obesity, unspecified: Secondary | ICD-10-CM | POA: Diagnosis present

## 2013-01-20 DIAGNOSIS — C549 Malignant neoplasm of corpus uteri, unspecified: Principal | ICD-10-CM | POA: Diagnosis present

## 2013-01-20 DIAGNOSIS — R319 Hematuria, unspecified: Secondary | ICD-10-CM | POA: Diagnosis present

## 2013-01-20 DIAGNOSIS — Z87891 Personal history of nicotine dependence: Secondary | ICD-10-CM

## 2013-01-20 DIAGNOSIS — Z01810 Encounter for preprocedural cardiovascular examination: Secondary | ICD-10-CM

## 2013-01-20 DIAGNOSIS — I10 Essential (primary) hypertension: Secondary | ICD-10-CM | POA: Diagnosis present

## 2013-01-20 DIAGNOSIS — Z79899 Other long term (current) drug therapy: Secondary | ICD-10-CM

## 2013-01-20 DIAGNOSIS — Z6841 Body Mass Index (BMI) 40.0 and over, adult: Secondary | ICD-10-CM

## 2013-01-20 DIAGNOSIS — K219 Gastro-esophageal reflux disease without esophagitis: Secondary | ICD-10-CM | POA: Diagnosis present

## 2013-01-20 DIAGNOSIS — Z801 Family history of malignant neoplasm of trachea, bronchus and lung: Secondary | ICD-10-CM

## 2013-01-20 DIAGNOSIS — C541 Malignant neoplasm of endometrium: Secondary | ICD-10-CM | POA: Diagnosis present

## 2013-01-20 DIAGNOSIS — Z01812 Encounter for preprocedural laboratory examination: Secondary | ICD-10-CM

## 2013-01-20 DIAGNOSIS — Z791 Long term (current) use of non-steroidal anti-inflammatories (NSAID): Secondary | ICD-10-CM

## 2013-01-20 HISTORY — PX: ROBOTIC ASSISTED TOTAL HYSTERECTOMY WITH BILATERAL SALPINGO OOPHERECTOMY: SHX6086

## 2013-01-20 LAB — TYPE AND SCREEN
ABO/RH(D): O POS
Antibody Screen: NEGATIVE

## 2013-01-20 LAB — ABO/RH: ABO/RH(D): O POS

## 2013-01-20 SURGERY — ROBOTIC ASSISTED TOTAL HYSTERECTOMY WITH BILATERAL SALPINGO OOPHORECTOMY
Anesthesia: General | Laterality: Bilateral | Wound class: Clean Contaminated

## 2013-01-20 MED ORDER — HYDROMORPHONE HCL PF 1 MG/ML IJ SOLN
0.2500 mg | INTRAMUSCULAR | Status: DC | PRN
  Administered 2013-01-20: 0.25 mg via INTRAVENOUS

## 2013-01-20 MED ORDER — LACTATED RINGERS IV SOLN
INTRAVENOUS | Status: DC | PRN
  Administered 2013-01-20 (×2): via INTRAVENOUS

## 2013-01-20 MED ORDER — STERILE WATER FOR IRRIGATION IR SOLN
Status: DC | PRN
  Administered 2013-01-20: 3000 mL

## 2013-01-20 MED ORDER — SODIUM CHLORIDE 0.9 % IJ SOLN
INTRAMUSCULAR | Status: AC
  Administered 2013-01-20: 3 mL
  Filled 2013-01-20: qty 3

## 2013-01-20 MED ORDER — HYDROMORPHONE HCL PF 1 MG/ML IJ SOLN
0.5000 mg | INTRAMUSCULAR | Status: DC | PRN
  Administered 2013-01-20 (×2): 0.5 mg via INTRAVENOUS
  Filled 2013-01-20 (×2): qty 1

## 2013-01-20 MED ORDER — IBUPROFEN 600 MG PO TABS
600.0000 mg | ORAL_TABLET | Freq: Four times a day (QID) | ORAL | Status: DC | PRN
  Filled 2013-01-20: qty 1

## 2013-01-20 MED ORDER — CISATRACURIUM BESYLATE (PF) 10 MG/5ML IV SOLN
INTRAVENOUS | Status: DC | PRN
  Administered 2013-01-20: 2 mg via INTRAVENOUS
  Administered 2013-01-20: 8 mg via INTRAVENOUS
  Administered 2013-01-20: 2 mg via INTRAVENOUS

## 2013-01-20 MED ORDER — FENTANYL CITRATE 0.05 MG/ML IJ SOLN
INTRAMUSCULAR | Status: DC | PRN
  Administered 2013-01-20: 50 ug via INTRAVENOUS
  Administered 2013-01-20 (×2): 100 ug via INTRAVENOUS

## 2013-01-20 MED ORDER — ONDANSETRON HCL 4 MG PO TABS: 4.0000 mg | ORAL_TABLET | Freq: Four times a day (QID) | ORAL | Status: DC | PRN

## 2013-01-20 MED ORDER — PROMETHAZINE HCL 25 MG/ML IJ SOLN: 6.2500 mg | INTRAMUSCULAR | Status: DC | PRN

## 2013-01-20 MED ORDER — PROPOFOL 10 MG/ML IV BOLUS
INTRAVENOUS | Status: DC | PRN
  Administered 2013-01-20: 200 mg via INTRAVENOUS

## 2013-01-20 MED ORDER — LOSARTAN POTASSIUM-HCTZ 50-12.5 MG PO TABS: 1.0000 | ORAL_TABLET | Freq: Two times a day (BID) | ORAL | Status: DC

## 2013-01-20 MED ORDER — HYDROCHLOROTHIAZIDE 12.5 MG PO CAPS
12.5000 mg | ORAL_CAPSULE | Freq: Two times a day (BID) | ORAL | Status: DC
  Administered 2013-01-20 – 2013-01-21 (×3): 12.5 mg via ORAL
  Filled 2013-01-20 (×4): qty 1

## 2013-01-20 MED ORDER — HYDROMORPHONE HCL PF 1 MG/ML IJ SOLN
INTRAMUSCULAR | Status: AC
  Filled 2013-01-20: qty 1

## 2013-01-20 MED ORDER — PANTOPRAZOLE SODIUM 40 MG PO TBEC
80.0000 mg | DELAYED_RELEASE_TABLET | Freq: Every day | ORAL | Status: DC
  Administered 2013-01-21: 80 mg via ORAL
  Filled 2013-01-20: qty 2

## 2013-01-20 MED ORDER — ASPIRIN 81 MG PO CHEW
81.0000 mg | CHEWABLE_TABLET | Freq: Every evening | ORAL | Status: DC
  Administered 2013-01-20: 81 mg via ORAL
  Filled 2013-01-20 (×2): qty 1

## 2013-01-20 MED ORDER — MEPERIDINE HCL 50 MG/ML IJ SOLN: 6.2500 mg | INTRAMUSCULAR | Status: DC | PRN

## 2013-01-20 MED ORDER — DEXAMETHASONE SODIUM PHOSPHATE 10 MG/ML IJ SOLN
INTRAMUSCULAR | Status: DC | PRN
  Administered 2013-01-20: 10 mg via INTRAVENOUS

## 2013-01-20 MED ORDER — ATORVASTATIN CALCIUM 10 MG PO TABS
10.0000 mg | ORAL_TABLET | Freq: Every day | ORAL | Status: DC
  Filled 2013-01-20 (×2): qty 1

## 2013-01-20 MED ORDER — LIDOCAINE HCL (CARDIAC) 20 MG/ML IV SOLN
INTRAVENOUS | Status: DC | PRN
  Administered 2013-01-20: 75 mg via INTRAVENOUS

## 2013-01-20 MED ORDER — SUCCINYLCHOLINE CHLORIDE 20 MG/ML IJ SOLN
INTRAMUSCULAR | Status: DC | PRN
  Administered 2013-01-20: 140 mg via INTRAVENOUS

## 2013-01-20 MED ORDER — MIDAZOLAM HCL 5 MG/5ML IJ SOLN
INTRAMUSCULAR | Status: DC | PRN
  Administered 2013-01-20: 2 mg via INTRAVENOUS

## 2013-01-20 MED ORDER — CITRIC ACID-SODIUM CITRATE 334-500 MG/5ML PO SOLN
30.0000 mL | Freq: Once | ORAL | Status: AC
  Administered 2013-01-20: 60 mL via ORAL
  Filled 2013-01-20: qty 30

## 2013-01-20 MED ORDER — LOSARTAN POTASSIUM 50 MG PO TABS
50.0000 mg | ORAL_TABLET | Freq: Two times a day (BID) | ORAL | Status: DC
  Administered 2013-01-20 – 2013-01-21 (×3): 50 mg via ORAL
  Filled 2013-01-20 (×5): qty 1

## 2013-01-20 MED ORDER — KCL IN DEXTROSE-NACL 20-5-0.45 MEQ/L-%-% IV SOLN
INTRAVENOUS | Status: DC
  Administered 2013-01-20: 14:00:00 via INTRAVENOUS
  Filled 2013-01-20 (×2): qty 1000

## 2013-01-20 MED ORDER — OXYCODONE-ACETAMINOPHEN 5-325 MG PO TABS
1.0000 | ORAL_TABLET | ORAL | Status: DC | PRN
  Administered 2013-01-20 – 2013-01-21 (×2): 1 via ORAL
  Filled 2013-01-20 (×2): qty 1

## 2013-01-20 MED ORDER — LEVOTHYROXINE SODIUM 112 MCG PO TABS
112.0000 ug | ORAL_TABLET | Freq: Every day | ORAL | Status: DC
  Administered 2013-01-21: 112 ug via ORAL

## 2013-01-20 MED ORDER — LEVOTHYROXINE SODIUM 112 MCG PO TABS
112.0000 ug | ORAL_TABLET | Freq: Every day | ORAL | Status: DC
  Filled 2013-01-20: qty 1

## 2013-01-20 MED ORDER — ZOLPIDEM TARTRATE 5 MG PO TABS: 5.0000 mg | ORAL_TABLET | Freq: Every evening | ORAL | Status: DC | PRN

## 2013-01-20 MED ORDER — LACTATED RINGERS IV SOLN
INTRAVENOUS | Status: DC | PRN
  Administered 2013-01-20: 07:00:00 via INTRAVENOUS

## 2013-01-20 MED ORDER — SCOPOLAMINE 1 MG/3DAYS TD PT72
MEDICATED_PATCH | TRANSDERMAL | Status: DC | PRN
  Administered 2013-01-20: 1 via TRANSDERMAL

## 2013-01-20 MED ORDER — ONDANSETRON HCL 4 MG/2ML IJ SOLN: 4.0000 mg | Freq: Four times a day (QID) | INTRAMUSCULAR | Status: DC | PRN

## 2013-01-20 MED ORDER — LACTATED RINGERS IV SOLN: INTRAVENOUS | Status: DC

## 2013-01-20 MED ORDER — EZETIMIBE 10 MG PO TABS
10.0000 mg | ORAL_TABLET | Freq: Every day | ORAL | Status: DC
  Administered 2013-01-20: 10 mg via ORAL
  Filled 2013-01-20 (×2): qty 1

## 2013-01-20 MED ORDER — ENOXAPARIN SODIUM 40 MG/0.4ML ~~LOC~~ SOLN
40.0000 mg | SUBCUTANEOUS | Status: DC
  Administered 2013-01-21: 40 mg via SUBCUTANEOUS
  Filled 2013-01-20 (×2): qty 0.4

## 2013-01-20 MED ORDER — GLYCOPYRROLATE 0.2 MG/ML IJ SOLN
INTRAMUSCULAR | Status: DC | PRN
  Administered 2013-01-20: .6 mg via INTRAVENOUS
  Administered 2013-01-20: .2 mg via INTRAVENOUS

## 2013-01-20 MED ORDER — ENOXAPARIN SODIUM 40 MG/0.4ML ~~LOC~~ SOLN
40.0000 mg | SUBCUTANEOUS | Status: AC
Start: 2013-01-20 — End: 2013-01-20
  Administered 2013-01-20: 40 mg via SUBCUTANEOUS
  Filled 2013-01-20: qty 0.4

## 2013-01-20 MED ORDER — NEOSTIGMINE METHYLSULFATE 1 MG/ML IJ SOLN
INTRAMUSCULAR | Status: DC | PRN
  Administered 2013-01-20: 5 mg via INTRAVENOUS

## 2013-01-20 MED ORDER — SCOPOLAMINE 1 MG/3DAYS TD PT72
MEDICATED_PATCH | TRANSDERMAL | Status: AC
  Filled 2013-01-20: qty 1

## 2013-01-20 MED ORDER — ONDANSETRON HCL 4 MG/2ML IJ SOLN
INTRAMUSCULAR | Status: DC | PRN
  Administered 2013-01-20 (×2): 2 mg via INTRAVENOUS

## 2013-01-20 MED ORDER — LACTATED RINGERS IR SOLN
Status: DC | PRN
  Administered 2013-01-20: 1000 mL

## 2013-01-20 MED ORDER — METOCLOPRAMIDE HCL 5 MG/ML IJ SOLN
INTRAMUSCULAR | Status: DC | PRN
  Administered 2013-01-20: 10 mg via INTRAVENOUS

## 2013-01-20 SURGICAL SUPPLY — 53 items
BENZOIN TINCTURE PRP APPL 2/3 (GAUZE/BANDAGES/DRESSINGS) ×2 IMPLANT
CHLORAPREP W/TINT 26ML (MISCELLANEOUS) ×2 IMPLANT
CLOTH BEACON ORANGE TIMEOUT ST (SAFETY) ×2 IMPLANT
CORD HIGH FREQUENCY UNIPOLAR (ELECTROSURGICAL) ×2 IMPLANT
CORDS BIPOLAR (ELECTRODE) ×2 IMPLANT
COVER MAYO STAND STRL (DRAPES) ×2 IMPLANT
COVER SURGICAL LIGHT HANDLE (MISCELLANEOUS) ×2 IMPLANT
COVER TIP SHEARS 8 DVNC (MISCELLANEOUS) ×1 IMPLANT
COVER TIP SHEARS 8MM DA VINCI (MISCELLANEOUS) ×1
DRAPE LG THREE QUARTER DISP (DRAPES) ×4 IMPLANT
DRAPE SURG IRRIG POUCH 19X23 (DRAPES) ×2 IMPLANT
DRAPE TABLE BACK 44X90 PK DISP (DRAPES) ×4 IMPLANT
DRAPE UTILITY XL STRL (DRAPES) ×2 IMPLANT
DRAPE WARM FLUID 44X44 (DRAPE) ×2 IMPLANT
DRSG TEGADERM 2-3/8X2-3/4 SM (GAUZE/BANDAGES/DRESSINGS) ×2 IMPLANT
DRSG TEGADERM 4X4.75 (GAUZE/BANDAGES/DRESSINGS) ×2 IMPLANT
DRSG TEGADERM 6X8 (GAUZE/BANDAGES/DRESSINGS) ×4 IMPLANT
ELECT REM PT RETURN 9FT ADLT (ELECTROSURGICAL) ×2
ELECTRODE REM PT RTRN 9FT ADLT (ELECTROSURGICAL) ×1 IMPLANT
GAUZE SPONGE 2X2 8PLY STRL LF (GAUZE/BANDAGES/DRESSINGS) ×2 IMPLANT
GLOVE BIO SURGEON STRL SZ 6.5 (GLOVE) ×4 IMPLANT
GLOVE BIO SURGEON STRL SZ7.5 (GLOVE) ×4 IMPLANT
GLOVE BIOGEL PI IND STRL 7.0 (GLOVE) ×2 IMPLANT
GLOVE BIOGEL PI INDICATOR 7.0 (GLOVE) ×2
GOWN PREVENTION PLUS LG XLONG (DISPOSABLE) ×6 IMPLANT
GOWN PREVENTION PLUS XLARGE (GOWN DISPOSABLE) ×4 IMPLANT
HOLDER FOLEY CATH W/STRAP (MISCELLANEOUS) ×2 IMPLANT
KIT ACCESSORY DA VINCI DISP (KITS) ×1
KIT ACCESSORY DVNC DISP (KITS) ×1 IMPLANT
MANIPULATOR UTERINE 4.5 ZUMI (MISCELLANEOUS) ×2 IMPLANT
OCCLUDER COLPOPNEUMO (BALLOONS) ×2 IMPLANT
POUCH SPECIMEN RETRIEVAL 10MM (ENDOMECHANICALS) ×4 IMPLANT
SET TUBE IRRIG SUCTION NO TIP (IRRIGATION / IRRIGATOR) ×2 IMPLANT
SHEET LAVH (DRAPES) ×2 IMPLANT
SOLUTION ELECTROLUBE (MISCELLANEOUS) ×2 IMPLANT
SPONGE GAUZE 2X2 STER 10/PKG (GAUZE/BANDAGES/DRESSINGS) ×2
SPONGE LAP 18X18 X RAY DECT (DISPOSABLE) IMPLANT
STRIP CLOSURE SKIN 1/2X4 (GAUZE/BANDAGES/DRESSINGS) ×2 IMPLANT
SUT VIC AB 0 CT1 27 (SUTURE) ×3
SUT VIC AB 0 CT1 27XBRD ANTBC (SUTURE) ×3 IMPLANT
SUT VIC AB 4-0 PS2 27 (SUTURE) ×4 IMPLANT
SUT VICRYL 0 UR6 27IN ABS (SUTURE) ×2 IMPLANT
SYR 50ML LL SCALE MARK (SYRINGE) ×2 IMPLANT
SYR BULB IRRIGATION 50ML (SYRINGE) IMPLANT
TOWEL OR 17X26 10 PK STRL BLUE (TOWEL DISPOSABLE) ×4 IMPLANT
TRAP SPECIMEN MUCOUS 40CC (MISCELLANEOUS) ×2 IMPLANT
TRAY FOLEY CATH 14FRSI W/METER (CATHETERS) ×2 IMPLANT
TRAY LAP CHOLE (CUSTOM PROCEDURE TRAY) ×2 IMPLANT
TROCAR 12M 150ML BLUNT (TROCAR) ×2 IMPLANT
TROCAR BLADELESS OPT 5 100 (ENDOMECHANICALS) ×2 IMPLANT
TROCAR XCEL 12X100 BLDLESS (ENDOMECHANICALS) ×2 IMPLANT
TUBING INSUFFLATION 10FT LAP (TUBING) ×2 IMPLANT
WATER STERILE IRR 1500ML POUR (IV SOLUTION) ×4 IMPLANT

## 2013-01-20 NOTE — Anesthesia Postprocedure Evaluation (Signed)
  Anesthesia Post-op Note  Patient: [employer]  Procedure(s) Performed: Procedure(s) (LRB): ROBOTIC ASSISTED TOTAL ABDOMINAL HYSTERECTOMY WITH BILATERAL SALPINGO OOPHORECTOMY PELVIC LYMPH NODE DISSECTION (Bilateral)  Patient Location: PACU  Anesthesia Type: General  Level of Consciousness: awake and alert   Airway and Oxygen Therapy: Patient Spontanous Breathing  Post-op Pain: mild  Post-op Assessment: Post-op Vital signs reviewed, Patient's Cardiovascular Status Stable, Respiratory Function Stable, Patent Airway and No signs of Nausea or vomiting  Last Vitals:  Filed Vitals:   01/20/13 1129  BP: 164/84  Pulse: 68  Temp: 36.3 C  Resp: 16    Post-op Vital Signs: stable   Complications: No apparent anesthesia complications

## 2013-01-20 NOTE — Interval H&P Note (Signed)
History and Physical Interval Note:  01/20/2013 7:03 AM  Shannon Gardner  has presented today for surgery, with the diagnosis of ENDOMETRIAL CANCER   The various methods of treatment have been discussed with the patient and family. After consideration of risks, benefits and other options for treatment, the patient has consented to  Procedure(s): ROBOTIC ASSISTED TOTAL ABDOMINAL HYSTERECTOMY WITH BILATERAL SALPINGO OOPHORECTOMY POSSIBLE PELVIC LYMPH NODE DISSECTION, POSSIBLE LAPAROTOMY  (Bilateral) as a surgical intervention .  The patient's history has been reviewed, patient examined, no change in status, stable for surgery.  I have reviewed the patient's chart and labs.  Questions were answered to the patient's satisfaction.     Devonte Migues A.

## 2013-01-20 NOTE — H&P (View-Only) (Signed)
Consult Note: Gyn-Onc   Consult was requested by Dr. Guilford Gardner for the evaluation of [name] 67 y.o. female  CC:  Chief Complaint  Patient presents with  . Endo CA    New Consult    Assessment/Plan:  [name]  is a 67 y.o.  year old with a FIGO grade 1 endometrial adenocarcinoma and a BMI of approximately 48.  Uterine size could not be assessed on physical examination or on the imaging available.  The staging options discussed with the patient were minimal or open hysterectomy bilateral salpingo-oophorectomy and lymph node dissection.  On physical examination she lays well and significant amount of her obesity is in the buttocks and back.  I discussed the benefits of minimally invasive endometrial cancer staging the patient and her husband. I extensively reviewed the risks of robotic hysterectomy and possible lymph node dissection of infection, bleeding, damage to nearby organs including bowel, bladder, vessels, nerves, and ureters. We discussed postoperative risks including infection, PE/ DVT, and lymphedema. We reviewed possible need for conversion to open laparotomy, need for possible blood transfusion. I discussed positioning during surgery of trendelenberg and risks of minor facial swelling and care we take in preoperative positioning. We also reviewed possible need for further treatment such as radiation or chemotherapy based on final pathology. Expected postoperative recovery was also discussed. The plan is for robotic assisted laparoscopic hysterectomy bilateral salpingo-oophorectomy possible pelvic lymph node dissection on 01/20/2013 with Dr. Cleda Gardner.  All of her questions were answered her satisfaction   HPI: [name]  is a 67 y.o. gravida 4 para 4 last normal menstrual period in her early 54s. The patient presented with reports of hematuria specifically bloody mucous discharge within her urine over the last 9 months. A workup was undertaken  and that included an MRI which demonstrated a thickened irregular endometrium in the fundus measuring up to 2 cm diverticulosis of the colon was noted the adnexa appeared normal. The rectal remnant was noted that there were no findings of Eure, bladder cancer. Abdominal MRI was unremarkable and a CT scan did not add anything additional. She underwent cystoscopy and that was within normal limits. Underwent an endometrial biopsy on 12/18/2012 which demonstrated a FIGO grade 1 endometrial adenocarcinoma. His Pap negative HPV negative. A colonoscopy on August of 2014 was notable for the presence of a benign polyp.  The patient feels well denies any weight loss denies any changes in her bladder or rectal habits reports occasional bleeding denies any shortness of breath.   Current Meds:  Outpatient Encounter Prescriptions as of 12/30/2012  Medication Sig Dispense Refill  . acetaminophen (TYLENOL) 325 MG tablet Take 650 mg by mouth every 6 (six) hours as needed for pain.      Marland Kitchen aspirin 81 MG tablet Take 81 mg by mouth daily.      Marland Kitchen BIOGAIA PROBIOTIC (BIOGAIA PROBIOTIC) LIQD Take by mouth daily at 8 pm. OTC probiotic taken " at night"  Align.      . ezetimibe (ZETIA) 10 MG tablet Take 10 mg by mouth at bedtime.      Marland Kitchen ibuprofen (ADVIL,MOTRIN) 200 MG tablet Take 100 mg by mouth every 6 (six) hours as needed for pain.      Marland Kitchen levothyroxine (SYNTHROID, LEVOTHROID) 112 MCG tablet Take 112 mcg by mouth daily before breakfast.      . losartan-hydrochlorothiazide (HYZAAR) 50-12.5 MG per tablet Take 1 tablet by mouth 2 (two) times daily. One tablet in am,  and one tablet in pm.      . omeprazole (PRILOSEC) 40 MG capsule Take 40 mg by mouth 2 (two) times daily. Am and pm      . rosuvastatin (CRESTOR) 5 MG tablet Take 2.5 mg by mouth every other day.       No facility-administered encounter medications on file as of 12/30/2012.    Allergy:  Allergies  Allergen Reactions  . Altace [Ramipril]     Social Hx:    History   Social History  . Marital Status: Married    Spouse Name: N/A    Number of Children: N/A  . Years of Education: N/A   Occupational History  . Not on file.   Social History Main Topics  . Smoking status: Former Smoker    Start date: 03/26/1974    Quit date: 03/26/1988  . Smokeless tobacco: Not on file  . Alcohol Use: 0.0 oz/week     Comment: " 3-4 times a year"   . Drug Use: No  . Sexual Activity: Not Currently   Other Topics Concern  . Not on file   Social History Narrative  . No narrative on file    Past Surgical Hx:  Past Surgical History  Procedure Laterality Date  . Laparoscopic cholecystectomy  2000?  Marland Kitchen Left knee surg  2009    "Torn meniscus"   . Tonsilectomy, adenoidectomy, bilateral myringotomy and tubes  1955    Past Medical Hx:  Past Medical History  Diagnosis Date  . Hypertension   . Thyroid disease   . GERD (gastroesophageal reflux disease)   . Blood transfusion without reported diagnosis    diverticulosis  Past Gynecological History: Gravida 4 para 4 last normal menstrual period in her early 68s menarche at age 59 with regular menses reports oral contraceptive pill use intermittently over 10 years   No LMP recorded. Patient is postmenopausal. Denies any history of abnormal Pap test NSVD x4  Family Hx:  Family History  Problem Relation Age of Onset  . Cancer Mother   . Cancer Sister   . Cancer Brother    her mother was diagnosed leiomyosarcoma presumed of lung origin at age of 54. Sister diagnosed with lung cancer the age of 85 brother diagnosed with prostate cancer in his 32s  Review of Systems:  Constitutional  Feels well,  denies recent weight loss Cardiovascular  No chest pain, shortness of breath, or edema  Pulmonary  No cough or wheeze.  Gastro Intestinal  No nausea, vomitting, or diarrhoea. No bright red blood per rectum, no abdominal pain, change in bowel movement, or constipation.  Genito Urinary  No frequency,  urgency, dysuria, and omentum vaginal bleeding no vaginal discharge  Musculo Skeletal  No myalgia, arthralgia, occasional arthritic pain Neurologic  No weakness, numbness, change in gait,  Psychology  No depression, anxiety, insomnia.   Vitals:  Blood pressure 150/77, pulse 66, temperature 97.8 F (36.6 C), temperature source Oral, resp. rate 18, height 5' 5.16" (1.655 m), weight 291 lb 6.4 oz (132.178 kg). Body mass index is 48.26 kg/(m^2).  Physical Exam: WD in NAD Neck  Supple NROM, without any enlargements.  Lymph Node Survey No cervical supraclavicular or inguinal adenopathy Cardiovascular  Pulse normal rate, regularity and rhythm. S1 and S2 normal.  Lungs  Clear to auscultation bilateraly, without wheezes/crackles/rhonchi. Good air movement.  Psychiatry  Alert and oriented to person, place, and time appropriate speech mood and affect Abdomen  Normoactive bowel sounds, abdomen soft, non-tender and obese.  Back No CVA tenderness Genito Urinary  Vulva/vagina: Normal external female genitalia.  No lesions. No discharge or bleeding.  Bladder/urethra:  No lesions or masses  Vagina: Atrophic no blood or discharge in the vault  Cervix: Normal appearing, no lesions. Cervical size approximately 3 cm  Uterus: Well supported  mobile, no parametrial involvement or nodularity. Unable to assess uterine size because of body habitus  Adnexa: unable to assess Rectal  Good tone, no masses no cul de sac nodularity.  Extremities  No bilateral cyanosis, clubbing or edema.   Laurette Schimke, MD, PhD 12/30/2012, 1:10 PM

## 2013-01-20 NOTE — Op Note (Signed)
PATIENT: Shannon Gardner DATE OF BIRTH: 10-Dec-1945 ENCOUNTER DATE: 01/20/2013   Preop Diagnosis: grade 1 endometrioid adenocarcinoma.   Postoperative Diagnosis: Same with no more than 50% myometrial invasion  Surgery: Total robotic hysterectomy bilateral salpingo-oophorectomy, bilateral pelvic lymph node dissection.   Surgeons:  Bernita Buffy. Duard Brady, MD; Antionette Char, MD   Anesthesia: General   Estimated blood loss:  20 ml   IVF: 2000 ml   Urine output:  125 ml   Complications: None   Pathology: Uterus, cervix, bilateral tubes and ovaries, bilateral pelvic lymph nodes to pathology.   Operative findings:  Physiologic adhesions of the left colon to the left pelvic side wall, no gross lymphadenopathy. Uterus and adnexa normal bilaterally.  Significant intra-abdominal adipose tissue. Frozen section with a grade 1 tumor with no more than 50% myometrial invasion.  Procedure: The patient was identified in the preoperative holding area. Informed consent was signed on the chart. Patient was seen history was reviewed and exam was performed.   The patient was then taken to the operating room and placed in the supine position with SCD hose on. General anesthesia was then induced without difficulty. She was then placed in the dorsolithotomy position. Her arms were tucked at her side with appropriate precautions on the gel pad with bilateral padded sleds. Shoulder blocks were then placed in the usual fashion with appropriate precautions. A OG-tube was placed to suction. First timeout was performed to confirm the patient, procedure, antibiotic, allergy status, estimated blood loss and OR time. The perineum was then prepped in the usual fashion with Betadine. A 14 French Foley was inserted into the bladder under sterile conditions. A sterile speculum was placed in the vagina. The cervix was without lesions. The cervix was grasped with a single-tooth tenaculum. The dilator without difficulty. A  ZUMI with a large Koe ring was placed without difficulty. The abdomen was then prepped with 2 Chlor prep sponges per protocol.   Patient was then draped after the prep was dried. Second timeout was performed to confirm the above. After again confirming OG tube placement and it was to suction. A stab-wound was made in left upper quadrant 2 cm below the costal margin on the left in the midclavicular line. A 5 mm operative report was used to assure intra-abdominal placement. The abdomen was insufflated. At this point all points during the procedure the patient's intra-abdominal pressure was not increased over 15 mm of mercury. After insufflation was complete, the patient was placed in deep Trendelenburg position. 25 cm above the pubic symphysis that area was marked the camera port. Bilateral robotic ports were marked 10 cm from the midline incision at approximately 5 angle. Under direct visualization each of the trochars was placed into the abdomen. The small bowel was folded on its mesentery to allow visualization to the pelvis. The 5 mm LUQ port was then converted to a 10/12 port under direct visualization.  After assuring adequate visualization, the robot was then docked in the usual fashion. Under direct visualization the robotic instruments replaced.   The round ligament on the patient's right side was transected with monopolar cautery. The anterior and posterior leaves of the broad ligament were then taken down in the usual fashion. The ureter was identified on the medial leaf of the broad ligament. A window was made between the IP and the ureter. The IP was coagulated with bipolar cautery and transected. The posterior leaf of the broad ligament was taken down to the level of the KOH ring. The  bladder flap was created using meticulous dissection and pinpoint cautery. The uterine vessels were coagulated with bipolar cautery. The uterine vessels were then transected and the C loop was created. The same  procedure was performed on the patient's left side.   The pneumo-occulder in the vagina was then insufflated. The colpotomy was then created in the usual fashion. The specimen was then delivered to the vagina and sent for frozen section. Our attention was then drawn to opening the paravesical space on her right side the perirectal space was also opened. The obturator nerve was identified. The nodal bundle extending over the external iliac artery down to the external iliac vein was taken down using sharp dissection and monopolar cautery. The genitofemoral nerve was identified and spared. We continued our dissection down to the level of the obturator nerve. The nodal bundle superior to the obturator nerve was taken. All pedicles were noted to be hemostatic the ureter was noted to be well medial of the area of dissection. The nodal bundle was then placed and an Endo catch bag. The same procedure was performed on the left pelvic sidewall nodes.  All specimens were delivered to the vagina. At this point frozen section returned as no more than 50% myometrial invasion of grade 1 lesion. The decision was made to stop the procedure as due to inadequate visualization due to morbid obesity, a para-aortic nodal dissection could not be performed via a minimally invasive approach and the risks and benefits of a laparotomy for para-aortic lymph nodes was not deemed appropriate.  The vaginal cuff was closed with a running 0 Vicryl on CT 1 suture. The abdomen and pelvis were copiously irrigated and noted to be hemostatic. The robotic instruments were removed under direct visualization as were the robotic trochars. The pneumoperitoneum was removed. The patient was then taken out of the Trendelenburg position. Using of 0 Vicryl on a UR 6 needle the midline port fascia was closed after being grasped with allis clamps. The subcutaneous tissues of the port in the left upper quadrant was reapproximated. The skin was closed using 4-0  Vicryl. Steri-Strips and benzoin were applied. The pneumo occluded balloon was removed from the vagina. The vagina was swabbed and noted to be hemostatic.   All instrument needle and Ray-Tec counts were correct x2. The patient tolerated the procedure well and was taken to the recovery room in stable condition. This is Shannon Gardner dictating an operative note on patient Shannon Gardner.

## 2013-01-20 NOTE — Transfer of Care (Signed)
Immediate Anesthesia Transfer of Care Note  Patient: [employer]  Procedure(s) Performed: Procedure(s): ROBOTIC ASSISTED TOTAL ABDOMINAL HYSTERECTOMY WITH BILATERAL SALPINGO OOPHORECTOMY PELVIC LYMPH NODE DISSECTION (Bilateral)  Patient Location: PACU  Anesthesia Type:General  Level of Consciousness: awake, oriented, patient cooperative, lethargic and responds to stimulation  Airway & Oxygen Therapy: Patient Spontanous Breathing and Patient connected to face mask oxygen  Post-op Assessment: Report given to PACU RN, Post -op Vital signs reviewed and stable and Patient moving all extremities  Post vital signs: Reviewed and stable  Complications: No apparent anesthesia complications

## 2013-01-20 NOTE — Progress Notes (Signed)
Pt up to chair for about a hour; able to ambulate around room with stand by assist

## 2013-01-20 NOTE — Preoperative (Signed)
Beta Blockers   Reason not to administer Beta Blockers:Not Applicable 

## 2013-01-20 NOTE — Anesthesia Preprocedure Evaluation (Addendum)
Anesthesia Evaluation    History of Anesthesia Complications (+) PONV and history of anesthetic complications  Airway       Dental   Pulmonary former smoker,          Cardiovascular hypertension, Pt. on medications     Neuro/Psych    GI/Hepatic GERD-  ,  Endo/Other    Renal/GU      Musculoskeletal   Abdominal   Peds  Hematology   Anesthesia Other Findings   Reproductive/Obstetrics                           Anesthesia Physical Anesthesia Plan  ASA: III  Anesthesia Plan: General   Post-op Pain Management:    Induction: Intravenous  Airway Management Planned: Oral ETT  Additional Equipment:   Intra-op Plan:   Post-operative Plan: Extubation in OR  Informed Consent: I have reviewed the patients History and Physical, chart, labs and discussed the procedure including the risks, benefits and alternatives for the proposed anesthesia with the patient or authorized representative who has indicated his/her understanding and acceptance.   Dental advisory given  Plan Discussed with: CRNA  Anesthesia Plan Comments:         Anesthesia Quick Evaluation

## 2013-01-20 NOTE — Anesthesia Procedure Notes (Signed)
Procedure Name: Intubation Date/Time: 01/20/2013 7:35 AM Performed by: Edison Pace Pre-anesthesia Checklist: Patient identified, Emergency Drugs available, Suction available, Patient being monitored and Timeout performed Patient Re-evaluated:Patient Re-evaluated prior to inductionOxygen Delivery Method: Circle system utilized Preoxygenation: Pre-oxygenation with 100% oxygen Intubation Type: Cricoid Pressure applied, IV induction and Rapid sequence Laryngoscope Size: Mac and 4 Grade View: Grade I Tube type: Oral Tube size: 7.5 mm Number of attempts: 1 Airway Equipment and Method: Stylet Placement Confirmation: ETT inserted through vocal cords under direct vision and positive ETCO2 Secured at: 21 cm Tube secured with: Tape Dental Injury: Teeth and Oropharynx as per pre-operative assessment

## 2013-01-20 NOTE — Progress Notes (Signed)
Utilization review completed.  

## 2013-01-21 ENCOUNTER — Encounter (HOSPITAL_COMMUNITY): Payer: Self-pay | Admitting: Gynecologic Oncology

## 2013-01-21 LAB — CBC
HCT: 41.3 % (ref 36.0–46.0)
Hemoglobin: 13.6 g/dL (ref 12.0–15.0)
MCH: 29.6 pg (ref 26.0–34.0)
MCHC: 32.9 g/dL (ref 30.0–36.0)
MCV: 89.8 fL (ref 78.0–100.0)
Platelets: 318 10*3/uL (ref 150–400)
RBC: 4.6 MIL/uL (ref 3.87–5.11)
RDW: 13.9 % (ref 11.5–15.5)
WBC: 12.2 10*3/uL — ABNORMAL HIGH (ref 4.0–10.5)

## 2013-01-21 LAB — BASIC METABOLIC PANEL
BUN: 10 mg/dL (ref 6–23)
CO2: 30 mEq/L (ref 19–32)
Calcium: 9.3 mg/dL (ref 8.4–10.5)
Chloride: 100 mEq/L (ref 96–112)
Creatinine, Ser: 0.78 mg/dL (ref 0.50–1.10)
GFR calc Af Amer: >90 mL/min (ref >90)
GFR calc non Af Amer: 85 mL/min — ABNORMAL LOW (ref >90)
Glucose, Bld: 123 mg/dL — ABNORMAL HIGH (ref 70–99)
Potassium: 3.9 mEq/L (ref 3.5–5.1)
Sodium: 137 mEq/L (ref 135–145)

## 2013-01-21 MED ORDER — OXYCODONE-ACETAMINOPHEN 5-325 MG PO TABS: 1.0000 | ORAL_TABLET | ORAL | Status: DC | PRN

## 2013-01-21 NOTE — Discharge Summary (Signed)
Physician Discharge Summary  Patient ID: Shannon Gardner MRN: [id number] DOB/AGE: [birth year] 67 y.o.  Admit date: 01/20/2013 Discharge date: 01/21/2013  Admission Diagnoses: Endometrial cancer  Discharge Diagnoses:  Principal Problem:   Endometrial cancer  Discharged Condition:  The patient is in good condition and stable for discharge.    Hospital Course: On 01/20/2013, the patient underwent the following: Procedure(s): ROBOTIC ASSISTED TOTAL ABDOMINAL HYSTERECTOMY WITH BILATERAL SALPINGO OOPHORECTOMY PELVIC LYMPH NODE DISSECTION.  The postoperative course was uneventful.  She was discharged to home on postoperative day 1 tolerating a regular diet and passing flatus.  Consults: None  Significant Diagnostic Studies: None  Treatments: surgery: see above  Discharge Exam: Blood pressure 111/70, pulse 63, temperature 98 F (36.7 C), temperature source Oral, resp. rate 18, height 5\' 5"  (1.651 m), weight 291 lb 0.1 oz (132 kg), SpO2 95.00%. General appearance: alert, cooperative and no distress Resp: clear to auscultation bilaterally Cardio: regular rate and rhythm, S1, S2 normal, no murmur, click, rub or gallop GI: soft, non-tender; bowel sounds normal; no masses,  no organomegaly and abdomen obese Extremities: extremities normal, atraumatic, no cyanosis or edema Incision/Wound: Lap sites to abdomen with steri strips clean, dry, and intact  Disposition: Home  Discharge Orders   Future Appointments Provider Department Dept Phone   01/29/2013 4:00 PM Paola A. Duard Brady, MD [facility] [phone]   Future Orders Complete By Expires   Call MD for:  difficulty breathing, headache or visual disturbances  As directed    Call MD for:  extreme fatigue  As directed    Call MD for:  hives  As directed    Call MD for:  persistant dizziness or light-headedness  As directed    Call MD for:  persistant nausea and vomiting  As directed    Call MD for:   redness, tenderness, or signs of infection (pain, swelling, redness, odor or green/yellow discharge around incision site)  As directed    Call MD for:  severe uncontrolled pain  As directed    Call MD for:  temperature >100.4  As directed    Diet - low sodium heart healthy  As directed    Driving Restrictions  As directed    Comments:     No driving for 1 week.  Do not take narcotics and drive.   Increase activity slowly  As directed    Lifting restrictions  As directed    Comments:     No lifting greater than 10 lbs.   Sexual Activity Restrictions  As directed    Comments:     No sexual activity, nothing in the vagina, for 8 weeks.       Medication List    STOP taking these medications       potassium chloride SA 20 MEQ tablet  Commonly known as:  K-DUR,KLOR-CON      TAKE these medications       acetaminophen 325 MG tablet  Commonly known as:  TYLENOL  Take 325 mg by mouth every 6 (six) hours as needed for pain.     aspirin 81 MG tablet  Take 81 mg by mouth every evening.     BIOGAIA PROBIOTIC Liqd  Take by mouth daily at 8 pm. OTC probiotic taken " at night"  Align.     ezetimibe 10 MG tablet  Commonly known as:  ZETIA  Take 10 mg by mouth at bedtime.     ibuprofen 200 MG tablet  Commonly known  as:  ADVIL,MOTRIN  Take 100 mg by mouth every 6 (six) hours as needed for pain.     levothyroxine 112 MCG tablet  Commonly known as:  SYNTHROID, LEVOTHROID  Take 112 mcg by mouth daily before breakfast. No Substitutions":Synthroid only"     losartan-hydrochlorothiazide 50-12.5 MG per tablet  Commonly known as:  HYZAAR  Take 1 tablet by mouth 2 (two) times daily. One tablet in am, and one tablet in pm.     omeprazole 40 MG capsule  Commonly known as:  PRILOSEC  Take 40 mg by mouth 2 (two) times daily. Am and pm     oxyCODONE-acetaminophen 5-325 MG per tablet  Commonly known as:  PERCOCET/ROXICET  Take 1-2 tablets by mouth every 4 (four) hours as needed (moderate to  severe pain).     rosuvastatin 5 MG tablet  Commonly known as:  CRESTOR  Take 2.5 mg by mouth every other day.     SYSTANE 0.4-0.3 % Soln  Generic drug:  Polyethyl Glycol-Propyl Glycol  Apply 1 drop to eye 2 (two) times daily as needed (dry eyes).           Follow-up Information   Follow up with [facility] A., MD On 01/29/2013. (at 4:00pm)    Specialty:  Gynecologic Oncology   Contact information:   [address] [place] [address] [phone]       Greater than thirty minutes were spend for face to face discharge instructions and discharge orders/summary in EPIC.   Signed: Rozena Fierro DEAL 01/21/2013, 8:39 AM

## 2013-01-21 NOTE — Care Management Note (Signed)
    Page 1 of 1   01/21/2013     10:17:28 AM   CARE MANAGEMENT NOTE 01/21/2013  Patient:  Shannon Gardner, Shannon Gardner   Account Number:  [id number]  Date Initiated:  01/21/2013  Documentation initiated by:  Lorenda Ishihara  Subjective/Objective Assessment:   67 yo female admitted s/p robotic TAH, BSO. PTA lived at home with spouse.     Action/Plan:   Home when stable   Anticipated DC Date:  01/21/2013   Anticipated DC Plan:  HOME/SELF CARE      DC Planning Services  CM consult      Choice offered to / List presented to:             Status of service:  Completed, signed off Medicare Important Message given?   (If response is "NO", the following Medicare IM given date fields will be blank) Date Medicare IM given:   Date Additional Medicare IM given:    Discharge Disposition:  HOME/SELF CARE  Per UR Regulation:  Reviewed for med. necessity/level of care/duration of stay  If discussed at Long Length of Stay Meetings, dates discussed:    Comments:

## 2013-01-22 ENCOUNTER — Telehealth: Payer: Self-pay | Admitting: Gynecologic Oncology

## 2013-01-22 NOTE — Telephone Encounter (Signed)
Patient informed of final pathology results.  No additional therapy recommended per Dr. Cleda Mccreedy.  Instructed to call for any questions or concerns.  Follow up appointment arranged.

## 2013-01-26 ENCOUNTER — Telehealth: Payer: Self-pay | Admitting: Gynecologic Oncology

## 2013-01-26 ENCOUNTER — Emergency Department (HOSPITAL_COMMUNITY)
Admission: EM | Admit: 2013-01-26 | Discharge: 2013-01-26 | Disposition: A | Discharge status: Home / Self Care | Payer: Medicare Other | Attending: Emergency Medicine | Admitting: Emergency Medicine

## 2013-01-26 ENCOUNTER — Encounter (HOSPITAL_COMMUNITY): Payer: Self-pay | Admitting: Emergency Medicine

## 2013-01-26 ENCOUNTER — Ambulatory Visit (HOSPITAL_COMMUNITY)
Admission: RE | Admit: 2013-01-26 | Discharge: 2013-01-26 | Disposition: A | Discharge status: Home / Self Care | Payer: Medicare Other | Source: Ambulatory Visit | Attending: Emergency Medicine | Admitting: Emergency Medicine

## 2013-01-26 DIAGNOSIS — C549 Malignant neoplasm of corpus uteri, unspecified: Secondary | ICD-10-CM | POA: Insufficient documentation

## 2013-01-26 DIAGNOSIS — I10 Essential (primary) hypertension: Secondary | ICD-10-CM | POA: Insufficient documentation

## 2013-01-26 DIAGNOSIS — M79609 Pain in unspecified limb: Secondary | ICD-10-CM

## 2013-01-26 DIAGNOSIS — H269 Unspecified cataract: Secondary | ICD-10-CM | POA: Insufficient documentation

## 2013-01-26 DIAGNOSIS — Z79899 Other long term (current) drug therapy: Secondary | ICD-10-CM | POA: Insufficient documentation

## 2013-01-26 DIAGNOSIS — E039 Hypothyroidism, unspecified: Secondary | ICD-10-CM | POA: Insufficient documentation

## 2013-01-26 DIAGNOSIS — Z8542 Personal history of malignant neoplasm of other parts of uterus: Secondary | ICD-10-CM | POA: Insufficient documentation

## 2013-01-26 DIAGNOSIS — Z7982 Long term (current) use of aspirin: Secondary | ICD-10-CM | POA: Insufficient documentation

## 2013-01-26 DIAGNOSIS — Z90710 Acquired absence of both cervix and uterus: Secondary | ICD-10-CM | POA: Insufficient documentation

## 2013-01-26 DIAGNOSIS — Z87891 Personal history of nicotine dependence: Secondary | ICD-10-CM | POA: Insufficient documentation

## 2013-01-26 DIAGNOSIS — Z9889 Other specified postprocedural states: Secondary | ICD-10-CM | POA: Insufficient documentation

## 2013-01-26 DIAGNOSIS — K219 Gastro-esophageal reflux disease without esophagitis: Secondary | ICD-10-CM | POA: Insufficient documentation

## 2013-01-26 MED ORDER — ENOXAPARIN SODIUM 150 MG/ML ~~LOC~~ SOLN
1.0000 mg/kg | Freq: Once | SUBCUTANEOUS | Status: AC
  Administered 2013-01-26: 130 mg via SUBCUTANEOUS
  Filled 2013-01-26: qty 1

## 2013-01-26 NOTE — ED Provider Notes (Signed)
CSN: [id number]     Arrival date & time 01/26/13  0120 History   First MD Initiated Contact with Patient 01/26/13 0134     Chief Complaint  Patient presents with  . Leg Pain   (Consider location/radiation/quality/duration/timing/severity/associated sxs/prior Treatment) HPI This 67 year old female who underwent a hysterectomy 5 days ago for endometrial cancer. She states that her lymph nodes were negative for metastatic disease. She was here with left anterior medial thigh pain but began sometime yesterday. The pain is moderate and worse with movement or palpation. There is no associated swelling. There is no associated chest pain or shortness of breath. She is here concerned about a DVT.  Past Medical History  Diagnosis Date  . Hypertension   . Thyroid disease   . GERD (gastroesophageal reflux disease)   . PONV (postoperative nausea and vomiting)   . Hypothyroidism   . Headache(784.0)     migraines "cluster headaches"-most days  . Cataract     not surgical yet   Past Surgical History  Procedure Laterality Date  . Laparoscopic cholecystectomy  2000?  Marland Kitchen Left knee surg  2009    "Torn meniscus"   . Tonsilectomy, adenoidectomy, bilateral myringotomy and tubes  1955  . Robotic assisted total hysterectomy with bilateral salpingo oopherectomy Bilateral 01/20/2013    Procedure: ROBOTIC ASSISTED TOTAL ABDOMINAL HYSTERECTOMY WITH BILATERAL SALPINGO OOPHORECTOMY PELVIC LYMPH NODE DISSECTION;  Surgeon: Rejeana Brock A. Duard Brady, MD;  Location: [facility];  Service: Gynecology;  Laterality: Bilateral;   Family History  Problem Relation Age of Onset  . Cancer Mother   . Cancer Sister   . Cancer Brother    History  Substance Use Topics  . Smoking status: Former Smoker -- 12 years    Start date: 03/26/1974    Quit date: 03/26/1988  . Smokeless tobacco: Not on file  . Alcohol Use: 0.0 oz/week     Comment: rare occ " 3-4 times a year"    OB History   Grav Para Term Preterm Abortions TAB SAB Ect Mult  Living                 Review of Systems  All other systems reviewed and are negative.    Allergies  Altace  Home Medications   Current Outpatient Rx  Name  Route  Sig  Dispense  Refill  . Probiotic Product (ALIGN) 4 MG CAPS   Oral   Take 1 Can by mouth every evening.         Marland Kitchen acetaminophen (TYLENOL) 325 MG tablet   Oral   Take 325 mg by mouth every 6 (six) hours as needed for pain.          Marland Kitchen aspirin 81 MG tablet   Oral   Take 81 mg by mouth every evening.          . ezetimibe (ZETIA) 10 MG tablet   Oral   Take 10 mg by mouth at bedtime.         Marland Kitchen ibuprofen (ADVIL,MOTRIN) 200 MG tablet   Oral   Take 100 mg by mouth every 6 (six) hours as needed for pain.         Marland Kitchen levothyroxine (SYNTHROID, LEVOTHROID) 112 MCG tablet   Oral   Take 112 mcg by mouth daily before breakfast. No Substitutions " brand name Synthroid only"         . losartan-hydrochlorothiazide (HYZAAR) 50-12.5 MG per tablet   Oral   Take 1 tablet by mouth 2 (two) times  daily. One tablet in am, and one tablet in pm.         . omeprazole (PRILOSEC) 40 MG capsule   Oral   Take 40 mg by mouth 2 (two) times daily. Am and pm         . oxyCODONE-acetaminophen (PERCOCET/ROXICET) 5-325 MG per tablet   Oral   Take 1-2 tablets by mouth every 4 (four) hours as needed (moderate to severe pain).   30 tablet   0   . Polyethyl Glycol-Propyl Glycol (SYSTANE) 0.4-0.3 % SOLN   Ophthalmic   Apply 1 drop to eye 2 (two) times daily as needed (dry eyes).         . rosuvastatin (CRESTOR) 5 MG tablet   Oral   Take 2.5 mg by mouth every other day.          BP 159/90  Pulse 83  Temp(Src) 97.5 F (36.4 C) (Oral)  Resp 18  Ht 5\' 5"  (1.651 m)  Wt 291 lb (131.997 kg)  BMI 48.43 kg/m2  SpO2 95%  Physical Exam General: Well-developed, well-nourished female in no acute distress; appearance consistent with age of record HENT: normocephalic; atraumatic Eyes: pupils equal, round and reactive to  light; extraocular muscles intact Neck: supple Heart: regular rate and rhythm Lungs: clear to auscultation bilaterally Abdomen: soft; nondistended; well healing laparoscopy incisions without signs of infection Extremities: No deformity; full range of motion; pulses normal; no edema; left anterior medial thigh tenderness without swelling or palpable cord Neurologic: Awake, alert and oriented; motor function intact in all extremities and symmetric; no facial droop Skin: Warm and dry Psychiatric: Normal mood and affect    ED Course  Procedures (including critical care time)   MDM  This patient recently had surgery her d-dimer is not reliable as it will likely be elevated. We will presumptively treat with Lovenox and have her followup with the vascular lab for a Doppler study later today.    Hanley Seamen, MD 01/26/13 [phone]

## 2013-01-26 NOTE — Telephone Encounter (Signed)
Spoke with the patient about doppler results.  Stating that the pain originated on the anterior portion of her thigh.  Advised to take ibuprofen or tylenol as needed.  Instructed to call for any needs or concerns.

## 2013-01-26 NOTE — ED Notes (Signed)
Pt states she had a total hysterectomy on Tuesday   Pt states Sunday she developed pain in her left inner thigh that wont go away

## 2013-01-26 NOTE — Progress Notes (Signed)
Left lower extremity venous duplex completed.  Left:  No evidence of DVT, superficial thrombosis, or Baker's cyst.  Right:  Negative for DVT in the common femoral vein.  

## 2013-01-29 ENCOUNTER — Other Ambulatory Visit: Payer: Self-pay

## 2013-01-29 ENCOUNTER — Ambulatory Visit: Payer: Medicare Other | Admitting: Gynecologic Oncology

## 2013-02-26 ENCOUNTER — Ambulatory Visit: Payer: Medicare Other | Attending: Gynecologic Oncology | Admitting: Gynecologic Oncology

## 2013-02-26 ENCOUNTER — Encounter: Payer: Self-pay | Admitting: Gynecologic Oncology

## 2013-02-26 VITALS — BP 139/74 | HR 66 | Temp 97.6°F | Resp 18 | Ht 65.16 in | Wt 296.1 lb

## 2013-02-26 DIAGNOSIS — K219 Gastro-esophageal reflux disease without esophagitis: Secondary | ICD-10-CM | POA: Insufficient documentation

## 2013-02-26 DIAGNOSIS — Z90710 Acquired absence of both cervix and uterus: Secondary | ICD-10-CM | POA: Insufficient documentation

## 2013-02-26 DIAGNOSIS — Z79899 Other long term (current) drug therapy: Secondary | ICD-10-CM | POA: Insufficient documentation

## 2013-02-26 DIAGNOSIS — Z9079 Acquired absence of other genital organ(s): Secondary | ICD-10-CM | POA: Insufficient documentation

## 2013-02-26 DIAGNOSIS — I10 Essential (primary) hypertension: Secondary | ICD-10-CM | POA: Insufficient documentation

## 2013-02-26 DIAGNOSIS — E039 Hypothyroidism, unspecified: Secondary | ICD-10-CM | POA: Insufficient documentation

## 2013-02-26 DIAGNOSIS — C549 Malignant neoplasm of corpus uteri, unspecified: Secondary | ICD-10-CM | POA: Insufficient documentation

## 2013-02-26 DIAGNOSIS — C541 Malignant neoplasm of endometrium: Secondary | ICD-10-CM

## 2013-02-26 NOTE — Patient Instructions (Signed)
Follow up with Dr. Donivin Wirt in 6 months. 

## 2013-02-26 NOTE — Progress Notes (Signed)
Consult Note: Gyn-Onc   CC:  Chief Complaint  Patient presents with  . Endometrial cancer    Follow up    Assessment/Plan:  [name]  is a 67 y.o.  year old with a 1A grade 1 endometrioid adenocarcinoma with no lymphovascular space involvement. She's doing very well postoperatively. She is not require any adjuvant therapy. She return to see [place] in 6 months and we'll begin alternating visits with Dr. Seymour Bars.   HPI: [name]  is a 67 y.o. gravida 4 para 4 last normal menstrual period in her early 6s. The patient presented with reports of hematuria specifically bloody mucous discharge within her urine over the last 9 months. A workup was undertaken and that included an MRI which demonstrated a thickened irregular endometrium in the fundus measuring up to 2 cm diverticulosis of the colon was noted the adnexa appeared normal. The rectal remnant was noted that there were no findings of Eure, bladder cancer. Abdominal MRI was unremarkable and a CT scan did not add anything additional. She underwent cystoscopy and that was within normal limits. Underwent an endometrial biopsy on 12/18/2012 which demonstrated a FIGO grade 1 endometrial adenocarcinoma. His Pap negative HPV negative. A colonoscopy on August of 2014 was notable for the presence of a benign polyp.  Interval History: She underwent a total robotic hysterectomy bilateral pelvic lymph node dissection and bilateral salpingo-oophorectomy 01/20/2013. Operative findings included physiologic adhesions of the left colon to the left pelvic sidewall, no gross lymphadenopathy. The uterus and adnexa were normal bilaterally. There is significant drop to, no adipose tissue. Frozen section revealed a grade 1 tumor with no more than 50% myometrial invasion. Final pathology revealed:  1. Uterus +/- tubes/ovaries, neoplastic - INVASIVE GRADE I ENDOMETRIAL ADENOCARCINOMA, ENDOMETRIOID TYPE. - INVASIVE TUMOR CLOSELY APPROACHES  MIDDLE HALF OF ENDOMYOMETRIAL SECTION ALTHOUGH IS ULTIMATELY CONFINED TO INNER HALF. - DEFINITIVE LYMPH/VASCULAR INVASION IS NOT IDENTIFIED. - SEE ONCOLOGY TEMPLATE. ADDITIONAL FINDINGS: - BENIGN CERVIX. - BENIGN BILATERAL OVARIES. - BENIGN BILATERAL FALLOPIAN TUBES. 2. Lymph nodes, regional resection, right pelvic - FIVE BENIGN LYMPH NODES WITH NO TUMOR SEEN (0/5). 3. Lymph nodes, regional resection, left pelvic - THREE BENIGN LYMPH NODES WITH NO TUMOR SEEN (0/3).  She comes in today for her postoperative check. She's overall doing very well and is very pleased with how she's done since her surgery. She'll bleeding for the first week of surgery but had none since that time. She also will do pain on her thigh but that has subsequently resolved itself as well. She went to [place] 10 days after surgery has been quite active and busy with no concerns. She has normal bowel and bladder function.  Current Meds:  Outpatient Encounter Prescriptions as of 02/26/2013  Medication Sig  . acetaminophen (TYLENOL) 325 MG tablet Take 325 mg by mouth every 6 (six) hours as needed for pain.   Marland Kitchen aspirin 81 MG tablet Take 81 mg by mouth every evening.   . ezetimibe (ZETIA) 10 MG tablet Take 10 mg by mouth at bedtime.  Marland Kitchen levothyroxine (SYNTHROID, LEVOTHROID) 112 MCG tablet Take 112 mcg by mouth daily before breakfast. No Substitutions " brand name Synthroid only"  . losartan-hydrochlorothiazide (HYZAAR) 50-12.5 MG per tablet Take 1 tablet by mouth 2 (two) times daily. One tablet in am, and one tablet in pm.  . omeprazole (PRILOSEC) 40 MG capsule Take 40 mg by mouth 2 (two) times daily. Am and pm  . oxyCODONE-acetaminophen (PERCOCET/ROXICET) 5-325 MG per  tablet Take 1-2 tablets by mouth every 4 (four) hours as needed (moderate to severe pain).  Bertram Gala Glycol-Propyl Glycol (SYSTANE) 0.4-0.3 % SOLN Apply 1 drop to eye 2 (two) times daily as needed (dry eyes).  . Probiotic Product (ALIGN) 4 MG CAPS Take 1 Can  by mouth every evening.  . rosuvastatin (CRESTOR) 5 MG tablet Take 2.5 mg by mouth every other day.  . ibuprofen (ADVIL,MOTRIN) 200 MG tablet Take 200 mg by mouth every 6 (six) hours as needed for pain.     Allergy:  Allergies  Allergen Reactions  . Altace [Ramipril] Hives    Social Hx:   History   Social History  . Marital Status: Married    Spouse Name: N/A    Number of Children: N/A  . Years of Education: N/A   Occupational History  . Not on file.   Social History Main Topics  . Smoking status: Former Smoker -- 12 years    Start date: 03/26/1974    Quit date: 03/26/1988  . Smokeless tobacco: Not on file  . Alcohol Use: 0.0 oz/week     Comment: rare occ " 3-4 times a year"   . Drug Use: No  . Sexual Activity: Not Currently   Other Topics Concern  . Not on file   Social History Narrative  . No narrative on file    Past Surgical Hx:  Past Surgical History  Procedure Laterality Date  . Laparoscopic cholecystectomy  2000?  Marland Kitchen Left knee surg  2009    "Torn meniscus"   . Tonsilectomy, adenoidectomy, bilateral myringotomy and tubes  1955  . Robotic assisted total hysterectomy with bilateral salpingo oopherectomy Bilateral 01/20/2013    Procedure: ROBOTIC ASSISTED TOTAL ABDOMINAL HYSTERECTOMY WITH BILATERAL SALPINGO OOPHORECTOMY PELVIC LYMPH NODE DISSECTION;  Surgeon: Rejeana Brock A. Duard Brady, MD;  Location: [facility];  Service: Gynecology;  Laterality: Bilateral;    Past Medical Hx:  Past Medical History  Diagnosis Date  . Hypertension   . Thyroid disease   . GERD (gastroesophageal reflux disease)   . PONV (postoperative nausea and vomiting)   . Hypothyroidism   . Headache(784.0)     migraines "cluster headaches"-most days  . Cataract     not surgical yet   diverticulosis  Past Gynecological History: Gravida 4 para 4 last normal menstrual period in her early 67s menarche at age 39 with regular menses reports oral contraceptive pill use intermittently over 10 years   No  LMP recorded. Patient has had a hysterectomy. Denies any history of abnormal Pap test NSVD x4  Family Hx:  Family History  Problem Relation Age of Onset  . Cancer Mother   . Cancer Sister   . Cancer Brother    her mother was diagnosed leiomyosarcoma presumed of lung origin at age of 69. Sister diagnosed with lung cancer the age of 61 brother diagnosed with prostate cancer in his 31s    Vitals:  Blood pressure 139/74, pulse 66, temperature 97.6 F (36.4 C), temperature source Oral, resp. rate 18, height 5' 5.16" (1.655 m), weight 296 lb 1.6 oz (134.31 kg). Body mass index is 49.04 kg/(m^2).  Physical Exam: WD in NAD  Alert and oriented to person, place, and time appropriate speech mood and affect   Abdomen: Well-healed surgical incisions. Abdomen is soft and nontender.  Pelvic: Normal external female genitalia. The vaginal cuff is somewhat difficult to visualize secondary to habitus but is well-healed. Bimanual examination there is no nodularity tenderness or fluctuance.  Laniece Hornbaker A., MD 02/26/2013, 1:34 PM

## 2013-08-06 ENCOUNTER — Encounter: Payer: Self-pay | Admitting: Gynecologic Oncology

## 2013-08-06 ENCOUNTER — Ambulatory Visit: Payer: Medicare Other | Attending: Gynecologic Oncology | Admitting: Gynecologic Oncology

## 2013-08-06 VITALS — BP 163/89 | HR 70 | Temp 97.9°F | Resp 18 | Ht 65.16 in | Wt 295.5 lb

## 2013-08-06 DIAGNOSIS — C541 Malignant neoplasm of endometrium: Secondary | ICD-10-CM

## 2013-08-06 DIAGNOSIS — C549 Malignant neoplasm of corpus uteri, unspecified: Secondary | ICD-10-CM | POA: Insufficient documentation

## 2013-08-06 DIAGNOSIS — Z79899 Other long term (current) drug therapy: Secondary | ICD-10-CM | POA: Insufficient documentation

## 2013-08-06 DIAGNOSIS — K5730 Diverticulosis of large intestine without perforation or abscess without bleeding: Secondary | ICD-10-CM | POA: Insufficient documentation

## 2013-08-06 DIAGNOSIS — R319 Hematuria, unspecified: Secondary | ICD-10-CM | POA: Insufficient documentation

## 2013-08-06 DIAGNOSIS — E039 Hypothyroidism, unspecified: Secondary | ICD-10-CM | POA: Insufficient documentation

## 2013-08-06 DIAGNOSIS — I10 Essential (primary) hypertension: Secondary | ICD-10-CM | POA: Insufficient documentation

## 2013-08-06 DIAGNOSIS — Z87891 Personal history of nicotine dependence: Secondary | ICD-10-CM | POA: Insufficient documentation

## 2013-08-06 DIAGNOSIS — K219 Gastro-esophageal reflux disease without esophagitis: Secondary | ICD-10-CM | POA: Insufficient documentation

## 2013-08-06 NOTE — Patient Instructions (Signed)
See Dr. Dellis Filbert in 6 months and return to see Dr. Alycia Rossetti in 12 months.

## 2013-08-06 NOTE — Addendum Note (Signed)
Addended by: Lucile Crater on: 08/06/2013 02:18 PM   Modules accepted: Orders

## 2013-08-06 NOTE — Progress Notes (Signed)
Consult Note: Gyn-Onc   CC:  Chief Complaint  Patient presents with  . Endo ca    Follow up    Assessment/Plan:  [name]  is a 68 y.o.  year old with a 1A grade 1 endometrioid adenocarcinoma with no lymphovascular space involvement. She does not require any adjuvant therapy. She return to see [place] in 12 months and will see Dr. Dellis Filbert in 6 months. We'll notify her of the results of her vaginal cuff biopsy from today. If it is most likely granulation tissue. She was encouraged to increase your physical activity. She does have a  fitbit and was asked to increase her steps from 5000-10,000./day  HPI: [name]  is a 68 y.o. gravida 4 para 4 last normal menstrual period in her early 46s. The patient presented with reports of hematuria specifically bloody mucous discharge within her urine over the last 9 months. A workup was undertaken and that included an MRI which demonstrated a thickened irregular endometrium in the fundus measuring up to 2 cm diverticulosis of the colon was noted the adnexa appeared normal. The rectal remnant was noted that there were no findings of Eure, bladder cancer. Abdominal MRI was unremarkable and a CT scan did not add anything additional. She underwent cystoscopy and that was within normal limits. Underwent an endometrial biopsy on 12/18/2012 which demonstrated a FIGO grade 1 endometrial adenocarcinoma.  Pap negative HPV negative. A colonoscopy on August of 2014 was notable for the presence of a benign polyp.  Interval History: She underwent a total robotic hysterectomy bilateral pelvic lymph node dissection and bilateral salpingo-oophorectomy 01/20/2013. Operative findings included physiologic adhesions of the left colon to the left pelvic sidewall, no gross lymphadenopathy. The uterus and adnexa were normal bilaterally. There is significant drop to, no adipose tissue. Frozen section revealed a grade 1 tumor with no more than 50% myometrial  invasion. Final pathology revealed:  1. Uterus +/- tubes/ovaries, neoplastic - INVASIVE GRADE I ENDOMETRIAL ADENOCARCINOMA, ENDOMETRIOID TYPE. - INVASIVE TUMOR CLOSELY APPROACHES MIDDLE HALF OF ENDOMYOMETRIAL SECTION ALTHOUGH IS ULTIMATELY CONFINED TO INNER HALF. - DEFINITIVE LYMPH/VASCULAR INVASION IS NOT IDENTIFIED. - SEE ONCOLOGY TEMPLATE. ADDITIONAL FINDINGS: - BENIGN CERVIX. - BENIGN BILATERAL OVARIES. - BENIGN BILATERAL FALLOPIAN TUBES. 2. Lymph nodes, regional resection, right pelvic - FIVE BENIGN LYMPH NODES WITH NO TUMOR SEEN (0/5). 3. Lymph nodes, regional resection, left pelvic - THREE BENIGN LYMPH NODES WITH NO TUMOR SEEN (0/3).  Interval History: She's overall doing quite well. She really has a completely negative review of systems. She's not very physically active and admits that she could be. From about a physical perspective as well as having a time during the day she believes she can be more physically active. She does have a fit bit and admits that she could be wearing it.  Review of Systems  Constitutional: Denies fever. Skin: No rash, sores, jaundice, itching, or dryness.  Cardiovascular: No chest pain, shortness of breath, or edema  Pulmonary: No cough or wheeze.  Gastro Intestinal: No nausea, vomiting, constipation, or diarrhea reported. No bright red blood per rectum or change in bowel movement.  Genitourinary: No frequency, urgency, or dysuria.  Denies vaginal bleeding and discharge.  Musculoskeletal: No myalgia, arthralgia, joint swelling or pain.  Neurologic: No weakness, numbness, or change in gait.  Psychology: No changes   Current Meds:  Outpatient Encounter Prescriptions as of 08/06/2013  Medication Sig  . acetaminophen (TYLENOL) 325 MG tablet Take 325 mg by mouth every 6 (  six) hours as needed for pain.   Marland Kitchen ezetimibe (ZETIA) 10 MG tablet Take 10 mg by mouth at bedtime.  Marland Kitchen ibuprofen (ADVIL,MOTRIN) 200 MG tablet Take 200 mg by mouth every 6 (six) hours  as needed for pain.   Marland Kitchen levothyroxine (SYNTHROID, LEVOTHROID) 112 MCG tablet Take 112 mcg by mouth daily before breakfast. No Substitutions " brand name Synthroid only"  . losartan-hydrochlorothiazide (HYZAAR) 50-12.5 MG per tablet Take 1 tablet by mouth 2 (two) times daily. One tablet in am, and one tablet in pm.  . omeprazole (PRILOSEC) 40 MG capsule Take 40 mg by mouth 2 (two) times daily. Am and pm  . Polyethyl Glycol-Propyl Glycol (SYSTANE) 0.4-0.3 % SOLN Apply 1 drop to eye 2 (two) times daily as needed (dry eyes).  . Probiotic Product (ALIGN) 4 MG CAPS Take 1 Can by mouth every evening.  . rosuvastatin (CRESTOR) 5 MG tablet Take 2.5 mg by mouth every other day.  Marland Kitchen aspirin 81 MG tablet Take 81 mg by mouth every evening.   . [DISCONTINUED] oxyCODONE-acetaminophen (PERCOCET/ROXICET) 5-325 MG per tablet Take 1-2 tablets by mouth every 4 (four) hours as needed (moderate to severe pain).    Allergy:  Allergies  Allergen Reactions  . Altace [Ramipril] Hives    Social Hx:   History   Social History  . Marital Status: Married    Spouse Name: N/A    Number of Children: N/A  . Years of Education: N/A   Occupational History  . Not on file.   Social History Main Topics  . Smoking status: Former Smoker -- 12 years    Start date: 03/26/1974    Quit date: 03/26/1988  . Smokeless tobacco: Not on file  . Alcohol Use: 0.0 oz/week     Comment: rare occ " 3-4 times a year"   . Drug Use: No  . Sexual Activity: Not Currently   Other Topics Concern  . Not on file   Social History Narrative  . No narrative on file    Past Surgical Hx:  Past Surgical History  Procedure Laterality Date  . Laparoscopic cholecystectomy  2000?  Marland Kitchen Left knee surg  2009    "Torn meniscus"   . Tonsilectomy, adenoidectomy, bilateral myringotomy and tubes  1955  . Robotic assisted total hysterectomy with bilateral salpingo oopherectomy Bilateral 01/20/2013    Procedure: ROBOTIC ASSISTED TOTAL ABDOMINAL  HYSTERECTOMY WITH BILATERAL SALPINGO OOPHORECTOMY PELVIC LYMPH NODE DISSECTION;  Surgeon: Imagene Gurney A. Alycia Rossetti, MD;  Location: [facility];  Service: Gynecology;  Laterality: Bilateral;    Past Medical Hx:  Past Medical History  Diagnosis Date  . Hypertension   . Thyroid disease   . GERD (gastroesophageal reflux disease)   . PONV (postoperative nausea and vomiting)   . Hypothyroidism   . Headache(784.0)     migraines "cluster headaches"-most days  . Cataract     not surgical yet   diverticulosis  Past Gynecological History: Gravida 4 para 4 last normal menstrual period in her early 71s menarche at age 41 with regular menses reports oral contraceptive pill use intermittently over 10 years   No LMP recorded. Patient has had a hysterectomy. Denies any history of abnormal Pap test NSVD x4  Family Hx:  Family History  Problem Relation Age of Onset  . Cancer Mother   . Cancer Sister   . Cancer Brother    her mother was diagnosed leiomyosarcoma presumed of lung origin at age of 23. Sister diagnosed with lung cancer the age of  69 brother diagnosed with prostate cancer in his 18s    Vitals:  Blood pressure 163/89, pulse 70, temperature 97.9 F (36.6 C), resp. rate 18, height 5' 5.16" (1.655 m), weight 295 lb 8 oz (134.038 kg). Body mass index is 48.94 kg/(m^2).  Physical Exam: WD in NAD  Alert and oriented to person, place, and time appropriate speech mood and affect  Neck: Supple, no lymphadenopathy, no thyromegaly.  Lungs: Distant breath sounds clear to auscultation.  Cardiovascular: Regular rate and rhythm with distant heart sounds.    Abdomen: Well-healed surgical incisions. Abdomen is soft and nontender. No palpable masses or hepatosplenomegaly but exam is limited by habitus.  Extremities: 1+ nonpitting edema equal bilaterally.  Pelvic: Normal external female genitalia. The vaginal cuff is somewhat difficult to visualize secondary to habitus but is well-healed. There does appear to  be granulation tissue at the midportion of the cuff. After obtaining her verbal consent this was removed using a biopsy forcep. Hemostasis was obtained using silver nitrate. Bimanual examination there is no nodularity tenderness or fluctuance.      Zoriyah Scheidegger A. Alycia Rossetti, MD 08/06/2013, 1:29 PM

## 2013-08-10 ENCOUNTER — Telehealth: Payer: Self-pay | Admitting: *Deleted

## 2013-08-10 NOTE — Telephone Encounter (Signed)
Per MD, notified pt of pathology results, Vagina, biopsy -inflamed granulation tissue. No dysplasia or malignancy, this can be normal after surgery. Pt verbalized understanding.

## 2013-08-11 ENCOUNTER — Telehealth: Payer: Self-pay | Admitting: *Deleted

## 2013-08-11 NOTE — Telephone Encounter (Signed)
Message copied by Lucile Crater on Tue Aug 11, 2013 10:48 AM ------      Message from: Joylene John D      Created: Tue Aug 11, 2013  8:38 AM       Please let her know no cancer or abnormal cells on biopsy.            ----- Message -----         From: [facility]         Sent: 08/07/2013   3:04 PM           To: Dorothyann Gibbs, NP                   ------

## 2013-08-11 NOTE — Telephone Encounter (Signed)
Pt notified 5/18.

## 2013-12-28 ENCOUNTER — Emergency Department (HOSPITAL_COMMUNITY): Payer: Medicare Other

## 2013-12-28 ENCOUNTER — Ambulatory Visit (HOSPITAL_COMMUNITY)
Admission: EM | Admit: 2013-12-28 | Discharge: 2013-12-28 | Disposition: A | Discharge status: Home / Self Care | Payer: Medicare Other | Attending: Emergency Medicine | Admitting: Emergency Medicine

## 2013-12-28 ENCOUNTER — Encounter (HOSPITAL_COMMUNITY): Payer: Self-pay | Admitting: Emergency Medicine

## 2013-12-28 ENCOUNTER — Emergency Department (HOSPITAL_COMMUNITY): Payer: Medicare Other | Admitting: Anesthesiology

## 2013-12-28 ENCOUNTER — Encounter (HOSPITAL_COMMUNITY): Payer: Medicare Other | Admitting: Anesthesiology

## 2013-12-28 ENCOUNTER — Encounter (HOSPITAL_COMMUNITY)
Admission: EM | Disposition: A | Discharge status: Home / Self Care | Payer: Self-pay | Source: Home / Self Care | Attending: Emergency Medicine

## 2013-12-28 DIAGNOSIS — S52291B Other fracture of shaft of right ulna, initial encounter for open fracture type I or II: Secondary | ICD-10-CM | POA: Insufficient documentation

## 2013-12-28 DIAGNOSIS — I10 Essential (primary) hypertension: Secondary | ICD-10-CM | POA: Diagnosis not present

## 2013-12-28 DIAGNOSIS — Y92008 Other place in unspecified non-institutional (private) residence as the place of occurrence of the external cause: Secondary | ICD-10-CM | POA: Diagnosis not present

## 2013-12-28 DIAGNOSIS — S61511A Laceration without foreign body of right wrist, initial encounter: Secondary | ICD-10-CM | POA: Diagnosis not present

## 2013-12-28 DIAGNOSIS — S52331B Displaced oblique fracture of shaft of right radius, initial encounter for open fracture type I or II: Secondary | ICD-10-CM | POA: Diagnosis not present

## 2013-12-28 DIAGNOSIS — Z87891 Personal history of nicotine dependence: Secondary | ICD-10-CM | POA: Insufficient documentation

## 2013-12-28 DIAGNOSIS — W010XXA Fall on same level from slipping, tripping and stumbling without subsequent striking against object, initial encounter: Secondary | ICD-10-CM | POA: Diagnosis not present

## 2013-12-28 DIAGNOSIS — Z79899 Other long term (current) drug therapy: Secondary | ICD-10-CM | POA: Insufficient documentation

## 2013-12-28 DIAGNOSIS — S52601B Unspecified fracture of lower end of right ulna, initial encounter for open fracture type I or II: Secondary | ICD-10-CM

## 2013-12-28 DIAGNOSIS — S52501B Unspecified fracture of the lower end of right radius, initial encounter for open fracture type I or II: Secondary | ICD-10-CM

## 2013-12-28 DIAGNOSIS — E039 Hypothyroidism, unspecified: Secondary | ICD-10-CM | POA: Insufficient documentation

## 2013-12-28 DIAGNOSIS — Z7982 Long term (current) use of aspirin: Secondary | ICD-10-CM | POA: Diagnosis not present

## 2013-12-28 DIAGNOSIS — H269 Unspecified cataract: Secondary | ICD-10-CM | POA: Insufficient documentation

## 2013-12-28 DIAGNOSIS — K219 Gastro-esophageal reflux disease without esophagitis: Secondary | ICD-10-CM | POA: Diagnosis not present

## 2013-12-28 DIAGNOSIS — M25531 Pain in right wrist: Secondary | ICD-10-CM | POA: Diagnosis present

## 2013-12-28 HISTORY — PX: ORIF ULNAR FRACTURE: SHX5417

## 2013-12-28 LAB — TYPE AND SCREEN
ABO/RH(D): O POS
Antibody Screen: NEGATIVE

## 2013-12-28 LAB — CBC
HCT: 43.3 % (ref 36.0–46.0)
Hemoglobin: 14.2 g/dL (ref 12.0–15.0)
MCH: 29.5 pg (ref 26.0–34.0)
MCHC: 32.8 g/dL (ref 30.0–36.0)
MCV: 90 fL (ref 78.0–100.0)
Platelets: 248 10*3/uL (ref 150–400)
RBC: 4.81 MIL/uL (ref 3.87–5.11)
RDW: 14.1 % (ref 11.5–15.5)
WBC: 8.4 10*3/uL (ref 4.0–10.5)

## 2013-12-28 LAB — BASIC METABOLIC PANEL
Anion gap: 11 (ref 5–15)
BUN: 23 mg/dL (ref 6–23)
CO2: 27 mEq/L (ref 19–32)
Calcium: 9.3 mg/dL (ref 8.4–10.5)
Chloride: 100 mEq/L (ref 96–112)
Creatinine, Ser: 0.84 mg/dL (ref 0.50–1.10)
GFR calc Af Amer: 81 mL/min — ABNORMAL LOW (ref >90)
GFR calc non Af Amer: 70 mL/min — ABNORMAL LOW (ref >90)
Glucose, Bld: 122 mg/dL — ABNORMAL HIGH (ref 70–99)
Potassium: 3.2 mEq/L — ABNORMAL LOW (ref 3.7–5.3)
Sodium: 138 mEq/L (ref 137–147)

## 2013-12-28 LAB — PROTIME-INR
INR: 1.08 (ref 0.00–1.49)
Prothrombin Time: 14.1 seconds (ref 11.6–15.2)

## 2013-12-28 LAB — APTT: aPTT: 25 seconds (ref 24–37)

## 2013-12-28 SURGERY — OPEN REDUCTION INTERNAL FIXATION (ORIF) ULNAR FRACTURE
Anesthesia: General | Laterality: Right

## 2013-12-28 MED ORDER — ONDANSETRON HCL 4 MG/2ML IJ SOLN
INTRAMUSCULAR | Status: AC
  Filled 2013-12-28: qty 2

## 2013-12-28 MED ORDER — OXYCODONE-ACETAMINOPHEN 5-325 MG PO TABS: 1.0000 | ORAL_TABLET | ORAL | Status: DC | PRN

## 2013-12-28 MED ORDER — HYDROMORPHONE HCL 1 MG/ML IJ SOLN
INTRAMUSCULAR | Status: AC
  Filled 2013-12-28: qty 1

## 2013-12-28 MED ORDER — MORPHINE SULFATE 4 MG/ML IJ SOLN
4.0000 mg | Freq: Once | INTRAMUSCULAR | Status: AC
  Administered 2013-12-28: 4 mg via INTRAVENOUS
  Filled 2013-12-28: qty 1

## 2013-12-28 MED ORDER — LIDOCAINE HCL (CARDIAC) 20 MG/ML IV SOLN
INTRAVENOUS | Status: DC | PRN
  Administered 2013-12-28: 80 mg via INTRAVENOUS

## 2013-12-28 MED ORDER — PROPOFOL 10 MG/ML IV BOLUS
INTRAVENOUS | Status: DC | PRN
  Administered 2013-12-28: 50 mg via INTRAVENOUS
  Administered 2013-12-28: 150 mg via INTRAVENOUS

## 2013-12-28 MED ORDER — SUCCINYLCHOLINE CHLORIDE 20 MG/ML IJ SOLN
INTRAMUSCULAR | Status: AC
  Filled 2013-12-28: qty 1

## 2013-12-28 MED ORDER — MIDAZOLAM HCL 2 MG/2ML IJ SOLN
INTRAMUSCULAR | Status: AC
  Filled 2013-12-28: qty 2

## 2013-12-28 MED ORDER — FENTANYL CITRATE 0.05 MG/ML IJ SOLN
INTRAMUSCULAR | Status: AC
  Filled 2013-12-28: qty 5

## 2013-12-28 MED ORDER — TETANUS-DIPHTH-ACELL PERTUSSIS 5-2.5-18.5 LF-MCG/0.5 IM SUSP
0.5000 mL | Freq: Once | INTRAMUSCULAR | Status: AC
Start: 2013-12-28 — End: 2013-12-28
  Administered 2013-12-28: 0.5 mL via INTRAMUSCULAR
  Filled 2013-12-28: qty 0.5

## 2013-12-28 MED ORDER — BUPIVACAINE HCL (PF) 0.25 % IJ SOLN
INTRAMUSCULAR | Status: DC | PRN
  Administered 2013-12-28: 15 mL

## 2013-12-28 MED ORDER — LACTATED RINGERS IV SOLN
INTRAVENOUS | Status: DC | PRN
  Administered 2013-12-28 (×2): via INTRAVENOUS

## 2013-12-28 MED ORDER — 0.9 % SODIUM CHLORIDE (POUR BTL) OPTIME
TOPICAL | Status: DC | PRN
  Administered 2013-12-28: 1000 mL

## 2013-12-28 MED ORDER — CEFAZOLIN SODIUM-DEXTROSE 2-3 GM-% IV SOLR
2.0000 g | Freq: Once | INTRAVENOUS | Status: AC
  Administered 2013-12-28: 2 g via INTRAVENOUS
  Filled 2013-12-28: qty 50

## 2013-12-28 MED ORDER — SODIUM CHLORIDE 0.9 % IV SOLN
Freq: Once | INTRAVENOUS | Status: AC
  Administered 2013-12-28: 15:00:00 via INTRAVENOUS

## 2013-12-28 MED ORDER — PHENYLEPHRINE 40 MCG/ML (10ML) SYRINGE FOR IV PUSH (FOR BLOOD PRESSURE SUPPORT)
PREFILLED_SYRINGE | INTRAVENOUS | Status: AC
Start: 2013-12-28 — End: 2013-12-28
  Filled 2013-12-28: qty 10

## 2013-12-28 MED ORDER — ONDANSETRON HCL 4 MG/2ML IJ SOLN
INTRAMUSCULAR | Status: DC | PRN
  Administered 2013-12-28: 4 mg via INTRAVENOUS

## 2013-12-28 MED ORDER — DEXAMETHASONE SODIUM PHOSPHATE 10 MG/ML IJ SOLN
INTRAMUSCULAR | Status: DC | PRN
  Administered 2013-12-28: 4 mg via INTRAVENOUS

## 2013-12-28 MED ORDER — PHENYLEPHRINE HCL 10 MG/ML IJ SOLN
INTRAMUSCULAR | Status: DC | PRN
  Administered 2013-12-28: 80 ug via INTRAVENOUS

## 2013-12-28 MED ORDER — OXYCODONE HCL 5 MG/5ML PO SOLN: 5.0000 mg | Freq: Once | ORAL | Status: AC | PRN

## 2013-12-28 MED ORDER — FENTANYL CITRATE 0.05 MG/ML IJ SOLN
INTRAMUSCULAR | Status: DC | PRN
  Administered 2013-12-28 (×2): 50 ug via INTRAVENOUS

## 2013-12-28 MED ORDER — HYDROMORPHONE HCL 1 MG/ML IJ SOLN
0.2500 mg | INTRAMUSCULAR | Status: DC | PRN
  Administered 2013-12-28 (×2): 0.5 mg via INTRAVENOUS

## 2013-12-28 MED ORDER — CEFAZOLIN SODIUM 1-5 GM-% IV SOLN: 1.0000 g | Freq: Three times a day (TID) | INTRAVENOUS | Status: DC

## 2013-12-28 MED ORDER — DEXAMETHASONE SODIUM PHOSPHATE 4 MG/ML IJ SOLN
INTRAMUSCULAR | Status: AC
Start: 2013-12-28 — End: 2013-12-28
  Filled 2013-12-28: qty 1

## 2013-12-28 MED ORDER — SUCCINYLCHOLINE CHLORIDE 20 MG/ML IJ SOLN
INTRAMUSCULAR | Status: DC | PRN
  Administered 2013-12-28: 160 mg via INTRAVENOUS

## 2013-12-28 MED ORDER — OXYCODONE HCL 5 MG PO TABS
5.0000 mg | ORAL_TABLET | Freq: Once | ORAL | Status: AC | PRN
  Administered 2013-12-28: 5 mg via ORAL

## 2013-12-28 MED ORDER — ONDANSETRON HCL 4 MG/2ML IJ SOLN
4.0000 mg | Freq: Once | INTRAMUSCULAR | Status: AC | PRN
  Administered 2013-12-28: 4 mg via INTRAVENOUS

## 2013-12-28 MED ORDER — LIDOCAINE HCL (CARDIAC) 20 MG/ML IV SOLN
INTRAVENOUS | Status: AC
  Filled 2013-12-28: qty 5

## 2013-12-28 MED ORDER — BUPIVACAINE HCL (PF) 0.25 % IJ SOLN
INTRAMUSCULAR | Status: AC
  Filled 2013-12-28: qty 30

## 2013-12-28 MED ORDER — MIDAZOLAM HCL 5 MG/5ML IJ SOLN
INTRAMUSCULAR | Status: DC | PRN
  Administered 2013-12-28 (×2): 1 mg via INTRAVENOUS

## 2013-12-28 MED ORDER — PROPOFOL 10 MG/ML IV BOLUS
INTRAVENOUS | Status: AC
  Filled 2013-12-28: qty 20

## 2013-12-28 MED ORDER — OXYCODONE HCL 5 MG PO TABS
ORAL_TABLET | ORAL | Status: AC
  Filled 2013-12-28: qty 1

## 2013-12-28 MED ORDER — ONDANSETRON HCL 4 MG/2ML IJ SOLN
4.0000 mg | Freq: Once | INTRAMUSCULAR | Status: AC
  Administered 2013-12-28: 4 mg via INTRAVENOUS
  Filled 2013-12-28: qty 2

## 2013-12-28 SURGICAL SUPPLY — 60 items
BANDAGE ELASTIC 3 VELCRO ST LF (GAUZE/BANDAGES/DRESSINGS) IMPLANT
BANDAGE ELASTIC 4 VELCRO ST LF (GAUZE/BANDAGES/DRESSINGS) ×2 IMPLANT
BIT DRILL 2.5X2.75 QC CALB (BIT) ×2 IMPLANT
BNDG CMPR 9X4 STRL LF SNTH (GAUZE/BANDAGES/DRESSINGS) ×1
BNDG ESMARK 4X9 LF (GAUZE/BANDAGES/DRESSINGS) ×2 IMPLANT
BNDG GAUZE ELAST 4 BULKY (GAUZE/BANDAGES/DRESSINGS) ×2 IMPLANT
CORDS BIPOLAR (ELECTRODE) ×2 IMPLANT
COVER SURGICAL LIGHT HANDLE (MISCELLANEOUS) ×2 IMPLANT
DRAPE OEC MINIVIEW 54X84 (DRAPES) ×2 IMPLANT
DRAPE SURG 17X23 STRL (DRAPES) ×2 IMPLANT
DURAPREP 26ML APPLICATOR (WOUND CARE) IMPLANT
ELECT REM PT RETURN 9FT ADLT (ELECTROSURGICAL)
ELECTRODE REM PT RTRN 9FT ADLT (ELECTROSURGICAL) IMPLANT
GAUZE SPONGE 4X4 12PLY STRL (GAUZE/BANDAGES/DRESSINGS) IMPLANT
GAUZE XEROFORM 1X8 LF (GAUZE/BANDAGES/DRESSINGS) ×2 IMPLANT
GLOVE BIO SURGEON STRL SZ8.5 (GLOVE) ×2 IMPLANT
GLOVE BIOGEL PI IND STRL 6.5 (GLOVE) ×1 IMPLANT
GLOVE BIOGEL PI IND STRL 7.0 (GLOVE) ×1 IMPLANT
GLOVE BIOGEL PI INDICATOR 6.5 (GLOVE) ×1
GLOVE BIOGEL PI INDICATOR 7.0 (GLOVE) ×1
GOWN STRL REUS W/ TWL LRG LVL3 (GOWN DISPOSABLE) ×1 IMPLANT
GOWN STRL REUS W/ TWL XL LVL3 (GOWN DISPOSABLE) ×1 IMPLANT
GOWN STRL REUS W/TWL LRG LVL3 (GOWN DISPOSABLE) ×2
GOWN STRL REUS W/TWL XL LVL3 (GOWN DISPOSABLE) ×2
KIT BASIN OR (CUSTOM PROCEDURE TRAY) ×2 IMPLANT
KIT ROOM TURNOVER OR (KITS) ×2 IMPLANT
MANIFOLD NEPTUNE II (INSTRUMENTS) ×2 IMPLANT
NEEDLE 22X1 1/2 (OR ONLY) (NEEDLE) IMPLANT
NEEDLE HYPO 25GX1X1/2 BEV (NEEDLE) ×2 IMPLANT
NS IRRIG 1000ML POUR BTL (IV SOLUTION) ×2 IMPLANT
PACK ORTHO EXTREMITY (CUSTOM PROCEDURE TRAY) ×2 IMPLANT
PAD ARMBOARD 7.5X6 YLW CONV (MISCELLANEOUS) ×4 IMPLANT
PAD CAST 3X4 CTTN HI CHSV (CAST SUPPLIES) IMPLANT
PAD CAST 4YDX4 CTTN HI CHSV (CAST SUPPLIES) ×1 IMPLANT
PADDING CAST COTTON 3X4 STRL (CAST SUPPLIES)
PADDING CAST COTTON 4X4 STRL (CAST SUPPLIES) ×2
PENCIL BUTTON HOLSTER BLD 10FT (ELECTRODE) IMPLANT
PLATE LOCK COMP 6H FOOT (Plate) ×2 IMPLANT
SCREW CORT 3.5X16 815037016 (Screw) ×6 IMPLANT
SCREW CORTICAL 3.5MM  12MM (Screw) ×1 IMPLANT
SCREW CORTICAL 3.5MM 12MM (Screw) ×1 IMPLANT
SCREW CORTICAL 3.5MM 14MM (Screw) ×4 IMPLANT
SPLINT PLASTER CAST XFAST 4X15 (CAST SUPPLIES) ×1 IMPLANT
SPLINT PLASTER XTRA FAST SET 4 (CAST SUPPLIES) ×1
SPONGE GAUZE 4X4 12PLY STER LF (GAUZE/BANDAGES/DRESSINGS) ×2 IMPLANT
SPONGE LAP 4X18 X RAY DECT (DISPOSABLE) ×4 IMPLANT
STAPLER VISISTAT 35W (STAPLE) ×2 IMPLANT
STRIP CLOSURE SKIN 1/2X4 (GAUZE/BANDAGES/DRESSINGS) IMPLANT
SUCTION FRAZIER TIP 10 FR DISP (SUCTIONS) IMPLANT
SUT PROLENE 3 0 PS 2 (SUTURE) IMPLANT
SUT VIC AB 2-0 CT1 27 (SUTURE) ×2
SUT VIC AB 2-0 CT1 TAPERPNT 27 (SUTURE) ×1 IMPLANT
SUT VICRYL 4-0 PS2 18IN ABS (SUTURE) IMPLANT
SYR CONTROL 10ML LL (SYRINGE) ×2 IMPLANT
TOWEL OR 17X24 6PK STRL BLUE (TOWEL DISPOSABLE) ×2 IMPLANT
TOWEL OR 17X26 10 PK STRL BLUE (TOWEL DISPOSABLE) ×2 IMPLANT
TUBE CONNECTING 12X1/4 (SUCTIONS) IMPLANT
UNDERPAD 30X30 INCONTINENT (UNDERPADS AND DIAPERS) ×2 IMPLANT
WATER STERILE IRR 1000ML POUR (IV SOLUTION) ×2 IMPLANT
WIRE K 1.6MM 144256 (MISCELLANEOUS) ×2 IMPLANT

## 2013-12-28 NOTE — ED Provider Notes (Signed)
TIME SEEN: 3:15 PM  CHIEF COMPLAINT: Fall, wrist pain, laceration  HPI: Patient is a 68 y.o. F with history of hypertension, hypothyroidism who presents emergency department after she tripped and fell going in her back door. She caught herself with her right hand. She is right-hand dominant. She has a laceration to the volar portion of the right wrist over the ulnar with exposed bone. She denies hitting her head or losing consciousness. She denies any numbness, tingling or weakness in her hand. Denies any preceding symptoms that led her fall.   She has been n.p.o. since 12:30 PM.  She is allergic to Altace, possible allergic to azithromycin and Tussionex.  ROS: See HPI Constitutional: no fever  Eyes: no drainage  ENT: no runny nose   Cardiovascular:  no chest pain  Resp: no SOB  GI: no vomiting GU: no dysuria Integumentary: no rash  Allergy: no hives  Musculoskeletal: no leg swelling  Neurological: no slurred speech ROS otherwise negative  PAST MEDICAL HISTORY/PAST SURGICAL HISTORY:  Past Medical History  Diagnosis Date  . Hypertension   . Thyroid disease   . GERD (gastroesophageal reflux disease)   . PONV (postoperative nausea and vomiting)   . Hypothyroidism   . Headache(784.0)     migraines "cluster headaches"-most days  . Cataract     not surgical yet    MEDICATIONS:  Prior to Admission medications   Medication Sig Start Date End Date Taking? Authorizing Provider  acetaminophen (TYLENOL) 325 MG tablet Take 325 mg by mouth every 6 (six) hours as needed for pain.     Historical Provider, MD  aspirin 81 MG tablet Take 81 mg by mouth every evening.     Historical Provider, MD  ezetimibe (ZETIA) 10 MG tablet Take 10 mg by mouth at bedtime.    Historical Provider, MD  ibuprofen (ADVIL,MOTRIN) 200 MG tablet Take 200 mg by mouth every 6 (six) hours as needed for pain.     Historical Provider, MD  levothyroxine (SYNTHROID, LEVOTHROID) 112 MCG tablet Take 112 mcg by mouth daily  before breakfast. No Substitutions " brand name Synthroid only"    Historical Provider, MD  losartan-hydrochlorothiazide (HYZAAR) 50-12.5 MG per tablet Take 1 tablet by mouth 2 (two) times daily. One tablet in am, and one tablet in pm.    Historical Provider, MD  omeprazole (PRILOSEC) 40 MG capsule Take 40 mg by mouth 2 (two) times daily. Am and pm    Historical Provider, MD  Polyethyl Glycol-Propyl Glycol (SYSTANE) 0.4-0.3 % SOLN Apply 1 drop to eye 2 (two) times daily as needed (dry eyes).    Historical Provider, MD  Probiotic Product (ALIGN) 4 MG CAPS Take 1 Can by mouth every evening.    Historical Provider, MD  rosuvastatin (CRESTOR) 5 MG tablet Take 2.5 mg by mouth every other day.    Historical Provider, MD    ALLERGIES:  Allergies  Allergen Reactions  . Altace [Ramipril] Hives    SOCIAL HISTORY:  History  Substance Use Topics  . Smoking status: Former Smoker -- 12 years    Start date: 03/26/1974    Quit date: 03/26/1988  . Smokeless tobacco: Not on file  . Alcohol Use: 0.0 oz/week     Comment: rare occ " 3-4 times a year"     FAMILY HISTORY: Family History  Problem Relation Age of Onset  . Cancer Mother   . Cancer Sister   . Cancer Brother     EXAM: BP 154/77  Pulse 86  Temp(Src) 97.4 F (36.3 C) (Oral)  Resp 18  SpO2 97% CONSTITUTIONAL: Alert and oriented and responds appropriately to questions. Well-appearing; well-nourished; GCS 15 HEAD: Normocephalic; atraumatic EYES: Conjunctivae clear, PERRL, EOMI ENT: normal nose; no rhinorrhea; moist mucous membranes; pharynx without lesions noted; no dental injury;  no septal hematoma NECK: Supple, no meningismus, no LAD; no midline spinal tenderness, step-off or deformity CARD: RRR; S1 and S2 appreciated; no murmurs, no clicks, no rubs, no gallops RESP: Normal chest excursion without splinting or tachypnea; breath sounds clear and equal bilaterally; no wheezes, no rhonchi, no rales; chest wall stable, nontender to  palpation ABD/GI: Normal bowel sounds; non-distended; soft, non-tender, no rebound, no guarding PELVIS:  stable, nontender to palpation BACK:  The back appears normal and is non-tender to palpation, there is no CVA tenderness; no midline spinal tenderness, step-off or deformity EXT: 3cm laceration over the volar oral portion of the right wrist with bone that is exposed, normal grip strength of the right hand, normal sensation throughout the right upper extremity, normal capillary refill, 2+ radial pulses bilaterally, extremities warm and well-perfused, compartments are soft, otherwise Normal ROM in all joints; otherwise extremities are non-tender to palpation; no edema; normal capillary refill; no cyanosis    SKIN: Normal color for age and race; warm NEURO: Moves all extremities equally; sensation to light touch intact diffusely, cranial nerves II through XII intact PSYCH: The patient's mood and manner are appropriate. Grooming and personal hygiene are appropriate.  MEDICAL DECISION MAKING: Patient here with mechanical fall. She has an open right wrist fracture. Will obtain x-rays, give tetanus vaccination and IV antibiotics. We'll obtain labs. Will keep patient n.p.o. We'll give pain and nausea medicine, IV fluid. We'll discuss with hand surgery on call and x-rays have been obtained.  ED PROGRESS: Chest x-ray shows significantly displaced fractures of the distal radius and ulna. We'll discuss with Dr. Burney Gauze.   4:32 PM  D/w Dr. Burney Gauze who has reviewed patient's x-rays. He would like her to be transferred to short stay at [facility]. Updated patient and family.  [place], DO 12/28/13 2351

## 2013-12-28 NOTE — ED Notes (Signed)
Pt was going on her back door when she tripped and fell. Pt has open fracture to right wrist. Bone has now gone back inside open wound.

## 2013-12-28 NOTE — ED Notes (Signed)
Patient transported to X-ray 

## 2013-12-28 NOTE — ED Notes (Signed)
Bed: WA23 Expected date:  Expected time:  Means of arrival:  Comments: Triage 1 

## 2013-12-28 NOTE — Transfer of Care (Signed)
Immediate Anesthesia Transfer of Care Note  Patient: Shannon Gardner  Procedure(s) Performed: Procedure(s): OPEN REDUCTION INTERNAL FIXATION (ORIF) ULNAR AND RADIAL FRACTURE (Right)  Patient Location: PACU  Anesthesia Type:General  Level of Consciousness: awake  Airway & Oxygen Therapy: Patient Spontanous Breathing and Patient connected to nasal cannula oxygen  Post-op Assessment: Report given to PACU RN and Post -op Vital signs reviewed and stable  Post vital signs: Reviewed and stable  Complications: No apparent anesthesia complications

## 2013-12-28 NOTE — Progress Notes (Signed)
ANTIBIOTIC CONSULT NOTE - INITIAL  Pharmacy Consult for Ancef Indication: wrist fracture  Allergies  Allergen Reactions  . Altace [Ramipril] Hives  . Tussicaps [Hydrocod Polst-Cpm Polst Er] Rash  . Zithromax [Azithromycin] Rash   Patient Measurements:   Total body weight: 134 kg (07/2013)  Vital Signs: Temp: 97.4 F (36.3 C) (10/05 1506) Temp Source: Oral (10/05 1506) BP: 154/77 mmHg (10/05 1506) Pulse Rate: 86 (10/05 1506) Intake/Output from previous day:   Intake/Output from this shift:    Labs: No results found for this basename: WBC, HGB, PLT, LABCREA, CREATININE,  in the last 72 hours The CrCl is unknown because both a height and weight (above a minimum accepted value) are required for this calculation. No results found for this basename: VANCOTROUGH, VANCOPEAK, VANCORANDOM, GENTTROUGH, GENTPEAK, GENTRANDOM, TOBRATROUGH, TOBRAPEAK, TOBRARND, AMIKACINPEAK, AMIKACINTROU, AMIKACIN,  in the last 72 hours   Microbiology: No results found for this or any previous visit (from the past 720 hour(s)).  Medical History: Past Medical History  Diagnosis Date  . Hypertension   . Thyroid disease   . GERD (gastroesophageal reflux disease)   . PONV (postoperative nausea and vomiting)   . Hypothyroidism   . Headache(784.0)     migraines "cluster headaches"-most days  . Cataract     not surgical yet   Medications:  Scheduled:    Assessment: 63 yoF in ED with R wrist fracture after fall at home, laceration with exposed bone. Hx of endometrial Ca, HTN, hypothyroid. Ancef ordered per pharmacy.  No hx of renal insufficiency, patient is obese  Goal of Therapy:  Dose and schedule of antibiotic appropriate for therapy, renal function  Plan:   Ancef 2gm x1, then 1gm q8hr  No need for dose/schedule adjustment in this patient  Minda Ditto PharmD Pager [phone] 12/28/2013, 3:51 PM

## 2013-12-28 NOTE — Op Note (Signed)
See note [id number]

## 2013-12-28 NOTE — ED Notes (Signed)
Per Dr. Leonides Schanz, patient will be transferred to the short stay unit at [facility] Awaiting orders

## 2013-12-28 NOTE — Anesthesia Preprocedure Evaluation (Addendum)
Anesthesia Evaluation  Patient identified by MRN, date of birth, ID band Patient awake    Reviewed: Allergy & Precautions, H&P , NPO status , Patient's Chart, lab work & pertinent test results  History of Anesthesia Complications (+) PONV  Airway Mallampati: I TM Distance: >3 FB Neck ROM: Full    Dental  (+) Teeth Intact, Dental Advisory Given   Pulmonary former smoker,  breath sounds clear to auscultation        Cardiovascular hypertension, Pt. on medications Rhythm:Regular Rate:Normal     Neuro/Psych    GI/Hepatic GERD-  Medicated and Controlled,  Endo/Other  Morbid obesity  Renal/GU      Musculoskeletal   Abdominal   Peds  Hematology   Anesthesia Other Findings   Reproductive/Obstetrics                           Anesthesia Physical Anesthesia Plan  ASA: III  Anesthesia Plan: General   Post-op Pain Management:    Induction: Intravenous, Rapid sequence and Cricoid pressure planned  Airway Management Planned: Oral ETT  Additional Equipment:   Intra-op Plan:   Post-operative Plan: Extubation in OR  Informed Consent: I have reviewed the patients History and Physical, chart, labs and discussed the procedure including the risks, benefits and alternatives for the proposed anesthesia with the patient or authorized representative who has indicated his/her understanding and acceptance.   Dental advisory given  Plan Discussed with: Anesthesiologist, CRNA and Surgeon  Anesthesia Plan Comments: (I chose not to use a Scopolamine patch due to her age.)       Anesthesia Quick Evaluation

## 2013-12-28 NOTE — Anesthesia Postprocedure Evaluation (Signed)
  Anesthesia Post-op Note  Patient: Shannon Gardner  Procedure(s) Performed: Procedure(s): OPEN REDUCTION INTERNAL FIXATION (ORIF) ULNAR AND RADIAL FRACTURE (Right)  Patient Location: PACU  Anesthesia Type: General   Level of Consciousness: awake, alert  and oriented  Airway and Oxygen Therapy: Patient Spontanous Breathing  Post-op Pain: mild  Post-op Assessment: Post-op Vital signs reviewed  Post-op Vital Signs: Reviewed  Last Vitals:  Filed Vitals:   12/28/13 2030  BP: 143/43  Pulse: 72  Temp:   Resp: 14    Complications: No apparent anesthesia complications

## 2013-12-28 NOTE — Consult Note (Signed)
Reason for Consult:right forearm fracture s/p fall Referring Physician: Ward  Avanell Shackleton Shannon Gardner is an 68 y.o. female.  HPI: s/p fall with open right distal radius and ulna fracture  Past Medical History  Diagnosis Date  . Hypertension   . Thyroid disease   . GERD (gastroesophageal reflux disease)   . PONV (postoperative nausea and vomiting)   . Hypothyroidism   . Headache(784.0)     migraines "cluster headaches"-most days  . Cataract     not surgical yet    Past Surgical History  Procedure Laterality Date  . Laparoscopic cholecystectomy  2000?  Marland Kitchen Left knee surg  2009    "Torn meniscus"   . Tonsilectomy, adenoidectomy, bilateral myringotomy and tubes  1955  . Robotic assisted total hysterectomy with bilateral salpingo oopherectomy Bilateral 01/20/2013    Procedure: ROBOTIC ASSISTED TOTAL ABDOMINAL HYSTERECTOMY WITH BILATERAL SALPINGO OOPHORECTOMY PELVIC LYMPH NODE DISSECTION;  Surgeon: Imagene Gurney A. Alycia Rossetti, MD;  Location: [facility] ORS;  Service: Gynecology;  Laterality: Bilateral;    Family History  Problem Relation Age of Onset  . Cancer Mother   . Cancer Sister   . Cancer Brother     Social History:  reports that she quit smoking about 25 years ago. She started smoking about 39 years ago. She has never used smokeless tobacco. She reports that she drinks alcohol. She reports that she does not use illicit drugs.  Allergies:  Allergies  Allergen Reactions  . Altace [Ramipril] Hives  . Tussicaps [Hydrocod Polst-Cpm Polst Er] Rash  . Zithromax [Azithromycin] Rash    Medications:  Scheduled: . [facility] HOLD]  ceFAZolin (ANCEF) IV  1 g Intravenous 3 times per day    Results for orders placed during the hospital encounter of 12/28/13 (from the past 48 hour(s))  CBC     Status: None   Collection Time    12/28/13  3:37 PM      Result Value Ref Range   WBC 8.4  4.0 - 10.5 K/uL   RBC 4.81  3.87 - 5.11 MIL/uL   Hemoglobin 14.2  12.0 - 15.0 g/dL   HCT 43.3  36.0 - 46.0 %   MCV 90.0   78.0 - 100.0 fL   MCH 29.5  26.0 - 34.0 pg   MCHC 32.8  30.0 - 36.0 g/dL   RDW 14.1  11.5 - 15.5 %   Platelets 248  150 - 400 K/uL  BASIC METABOLIC PANEL     Status: Abnormal   Collection Time    12/28/13  3:37 PM      Result Value Ref Range   Sodium 138  137 - 147 mEq/L   Potassium 3.2 (*) 3.7 - 5.3 mEq/L   Chloride 100  96 - 112 mEq/L   CO2 27  19 - 32 mEq/L   Glucose, Bld 122 (*) 70 - 99 mg/dL   BUN 23  6 - 23 mg/dL   Creatinine, Ser 0.84  0.50 - 1.10 mg/dL   Calcium 9.3  8.4 - 10.5 mg/dL   GFR calc non Af Amer 70 (*) >90 mL/min   GFR calc Af Amer 81 (*) >90 mL/min   Comment: (NOTE)     The eGFR has been calculated using the CKD EPI equation.     This calculation has not been validated in all clinical situations.     eGFR's persistently <90 mL/min signify possible Chronic Kidney     Disease.   Anion gap 11  5 - 15  APTT  Status: None   Collection Time    12/28/13  3:37 PM      Result Value Ref Range   aPTT 25  24 - 37 seconds  PROTIME-INR     Status: None   Collection Time    12/28/13  3:37 PM      Result Value Ref Range   Prothrombin Time 14.1  11.6 - 15.2 seconds   INR 1.08  0.00 - 1.49  TYPE AND SCREEN     Status: None   Collection Time    12/28/13  3:37 PM      Result Value Ref Range   ABO/RH(D) O POS     Antibody Screen NEG     Sample Expiration 12/31/2013      Dg Wrist Complete Right  12/28/2013   CLINICAL DATA:  Fall today with wrist injury. Reported open wound. Initial encounter.  EXAM: RIGHT WRIST - COMPLETE 3+ VIEW  COMPARISON:  None.  FINDINGS: The bones appear demineralized. There is an oblique fracture of the distal radial diaphysis with 2.1 cm of overriding of the fracture fragments. This fracture is associated with apex anterior angulation and radial displacement. There is also a fracture of the distal ulnar metaphysis with approximately 2.6 cm of proximal and dorsal displacement. The distal radioulnar joint appears grossly intact. There is no  evidence of carpal bone fracture or dislocation. Degenerative changes are present within the radial aspect of the wrist. There is diffuse soft tissue swelling without obvious soft tissue emphysema or foreign body.  IMPRESSION: Significantly displaced and angulated fractures of the distal radius and ulna as described (reportedly open). No dislocation.   Electronically Signed   By: Camie Patience M.D.   On: 12/28/2013 16:09    Review of Systems  All other systems reviewed and are negative.  Blood pressure 141/68, pulse 72, temperature 97.7 F (36.5 C), temperature source Oral, resp. rate 18, SpO2 93.00%. Physical Exam  Constitutional: She is oriented to person, place, and time. She appears well-developed and well-nourished.  HENT:  Head: Normocephalic and atraumatic.  Cardiovascular: Normal rate.   Respiratory: Effort normal.  Musculoskeletal:       Right forearm: She exhibits bony tenderness, deformity and laceration.  Distal third radius and ulna fracture with volar ulnar wound  Neurological: She is alert and oriented to person, place, and time.  Skin: Skin is warm.  Psychiatric: She has a normal mood and affect. Her behavior is normal. Judgment and thought content normal.    Assessment/Plan: As above   Plan ORIF above  Fordyce Lepak A 12/28/2013, 6:26 PM

## 2013-12-29 NOTE — Op Note (Signed)
Shannon Gardner, Shannon Gardner          ACCOUNT NO.:  [id number]  MEDICAL RECORD NO.:  [id number]  LOCATION:  [facility]                         FACILITY:  [facility]  PHYSICIAN:  Sheral Apley. Anurag Scarfo, M.D.DATE OF BIRTH:  07-18-1945  DATE OF PROCEDURE:  12/28/2013 DATE OF DISCHARGE:  12/28/2013                              OPERATIVE REPORT   PREOPERATIVE DIAGNOSIS:  Grade 1 open distal ulna and radius fractures.  POSTOPERATIVE DIAGNOSIS:  Grade 1 open distal ulna and radius fractures.  PROCEDURE:  Incision and drainage with open reduction and internal fixation of above.  SURGEON:  Sheral Apley. Burney Gauze, M.D.  ASSISTANT:  None.  ANESTHESIA:  General.  COMPLICATIONS:  No complication.  DRAINS:  No drains.  DESCRIPTION OF PROCEDURE:  The patient was taken to the operating suite. After the induction of adequate general anesthetic, right upper extremity was prepped and draped in sterile fashion.  An Esmarch was used to exsanguinate the limb.  Tourniquet was inflated to 275 mmHg.  At this point in time, incision over the FCU tendon area was exposed, distal ulna was closed proximally and distally.  The wound was thoroughly irrigated out with a liter of normal saline.  The fracture was reduced manually.  We then made an incision on the lower aspect of the forearm, distal third, incised the skin interval between FCR and radial artery was identified and split.  Dissection was carried down to the fracture site.  Fracture site was debrided of clot.  Reduction was performed.  A 6-hole compression plate was placed on the lower aspect with 3 cortical screws above and 3 cortical screws below in a precontoured plate.  Intraoperative fluoroscopy then revealed adequate reduction in AP, lateral, and oblique view.  We then made a small stab wound at the tip of the distal ulna and __________ a single 0.062 K-wire from distal to proximal across the fracture site under direct fluoroscopic guidance.  K-wire was cut  outside of the skin, bent upon __________.  The wound was irrigated one final time and closed in layers of 2-0 undyed Vicryl and staples on the skin.  The patient tolerated the procedure well in concealed fashion.    Sheral Apley Burney Gauze, M.D.    MAW/MEDQ  D:  12/28/2013  T:  12/29/2013  Job:  [id number]

## 2013-12-31 ENCOUNTER — Encounter (HOSPITAL_COMMUNITY): Payer: Self-pay | Admitting: Orthopedic Surgery

## 2014-03-29 DIAGNOSIS — S5291XD Unspecified fracture of right forearm, subsequent encounter for closed fracture with routine healing: Secondary | ICD-10-CM | POA: Diagnosis not present

## 2014-03-29 DIAGNOSIS — T8484XA Pain due to internal orthopedic prosthetic devices, implants and grafts, initial encounter: Secondary | ICD-10-CM | POA: Diagnosis not present

## 2014-04-01 DIAGNOSIS — S5291XD Unspecified fracture of right forearm, subsequent encounter for closed fracture with routine healing: Secondary | ICD-10-CM | POA: Diagnosis not present

## 2014-04-15 DIAGNOSIS — E789 Disorder of lipoprotein metabolism, unspecified: Secondary | ICD-10-CM | POA: Diagnosis not present

## 2014-04-22 DIAGNOSIS — K219 Gastro-esophageal reflux disease without esophagitis: Secondary | ICD-10-CM | POA: Diagnosis not present

## 2014-04-22 DIAGNOSIS — E789 Disorder of lipoprotein metabolism, unspecified: Secondary | ICD-10-CM | POA: Diagnosis not present

## 2014-05-13 DIAGNOSIS — S5291XD Unspecified fracture of right forearm, subsequent encounter for closed fracture with routine healing: Secondary | ICD-10-CM | POA: Diagnosis not present

## 2014-06-16 DIAGNOSIS — E789 Disorder of lipoprotein metabolism, unspecified: Secondary | ICD-10-CM | POA: Diagnosis not present

## 2014-06-16 DIAGNOSIS — I10 Essential (primary) hypertension: Secondary | ICD-10-CM | POA: Diagnosis not present

## 2014-06-16 DIAGNOSIS — T50905A Adverse effect of unspecified drugs, medicaments and biological substances, initial encounter: Secondary | ICD-10-CM | POA: Diagnosis not present

## 2014-06-16 DIAGNOSIS — E032 Hypothyroidism due to medicaments and other exogenous substances: Secondary | ICD-10-CM | POA: Diagnosis not present

## 2014-06-16 DIAGNOSIS — E559 Vitamin D deficiency, unspecified: Secondary | ICD-10-CM | POA: Diagnosis not present

## 2014-07-14 DIAGNOSIS — K219 Gastro-esophageal reflux disease without esophagitis: Secondary | ICD-10-CM | POA: Diagnosis not present

## 2014-07-14 DIAGNOSIS — R079 Chest pain, unspecified: Secondary | ICD-10-CM | POA: Diagnosis not present

## 2014-07-19 DIAGNOSIS — M810 Age-related osteoporosis without current pathological fracture: Secondary | ICD-10-CM | POA: Diagnosis not present

## 2014-07-19 DIAGNOSIS — E789 Disorder of lipoprotein metabolism, unspecified: Secondary | ICD-10-CM | POA: Diagnosis not present

## 2014-07-20 DIAGNOSIS — K5730 Diverticulosis of large intestine without perforation or abscess without bleeding: Secondary | ICD-10-CM | POA: Diagnosis not present

## 2014-07-20 DIAGNOSIS — K219 Gastro-esophageal reflux disease without esophagitis: Secondary | ICD-10-CM | POA: Diagnosis not present

## 2014-07-20 DIAGNOSIS — K641 Second degree hemorrhoids: Secondary | ICD-10-CM | POA: Diagnosis not present

## 2014-07-28 DIAGNOSIS — K219 Gastro-esophageal reflux disease without esophagitis: Secondary | ICD-10-CM | POA: Diagnosis not present

## 2014-07-31 DIAGNOSIS — M1712 Unilateral primary osteoarthritis, left knee: Secondary | ICD-10-CM | POA: Diagnosis not present

## 2014-08-11 ENCOUNTER — Encounter: Payer: Self-pay | Admitting: Gynecologic Oncology

## 2014-08-11 ENCOUNTER — Ambulatory Visit: Payer: Medicare Other | Attending: Gynecologic Oncology | Admitting: Gynecologic Oncology

## 2014-08-11 VITALS — BP 125/68 | HR 71 | Temp 97.6°F | Resp 16 | Ht 65.16 in | Wt 288.7 lb

## 2014-08-11 DIAGNOSIS — Z8542 Personal history of malignant neoplasm of other parts of uterus: Secondary | ICD-10-CM

## 2014-08-11 DIAGNOSIS — C541 Malignant neoplasm of endometrium: Secondary | ICD-10-CM | POA: Diagnosis not present

## 2014-08-11 DIAGNOSIS — Z90710 Acquired absence of both cervix and uterus: Secondary | ICD-10-CM

## 2014-08-11 NOTE — Patient Instructions (Addendum)
Follow-up with Dr. Dellis Filbert in 6 months and return to see Dr. Alycia Rossetti in 1 year (May 2017). Please call us in January or February to make this appt or sooner with any concerns you have.

## 2014-08-11 NOTE — Progress Notes (Signed)
Consult Note: Gyn-Onc   CC:  Chief Complaint  Patient presents with  . Endometrial Cancer    Follow up    Assessment/Plan:  [name]  is a 69 y.o.  year old with a 1A grade 1 endometrioid adenocarcinoma with no lymphovascular space involvement. She does not require any adjuvant therapy. She return to see [place] in 12 months and will see Dr. Dellis Filbert in 6 months. She was congratulated on her weight loss in the fact that she is walking 67,000 steps per day.  HPI: [name]  is a 69 y.o. gravida 4 para 4 last normal menstrual period in her early 81s. The patient presented with reports of hematuria specifically bloody mucous discharge within her urine over the last 9 months. A workup was undertaken and that included an MRI which demonstrated a thickened irregular endometrium in the fundus measuring up to 2 cm diverticulosis of the colon was noted the adnexa appeared normal. The rectal remnant was noted that there were no findings of Eure, bladder cancer. Abdominal MRI was unremarkable and a CT scan did not add anything additional. She underwent cystoscopy and that was within normal limits. Underwent an endometrial biopsy on 12/18/2012 which demonstrated a FIGO grade 1 endometrial adenocarcinoma.  Pap negative HPV negative. A colonoscopy on August of 2014 was notable for the presence of a benign polyp.  She underwent a total robotic hysterectomy bilateral pelvic lymph node dissection and bilateral salpingo-oophorectomy 01/20/2013. Operative findings included physiologic adhesions of the left colon to the left pelvic sidewall, no gross lymphadenopathy. The uterus and adnexa were normal bilaterally. There is significant drop to, no adipose tissue. Frozen section revealed a grade 1 tumor with no more than 50% myometrial invasion. Final pathology revealed:  1. Uterus +/- tubes/ovaries, neoplastic - INVASIVE GRADE I ENDOMETRIAL ADENOCARCINOMA, ENDOMETRIOID TYPE. - INVASIVE TUMOR  CLOSELY APPROACHES MIDDLE HALF OF ENDOMYOMETRIAL SECTION ALTHOUGH IS ULTIMATELY CONFINED TO INNER HALF. - DEFINITIVE LYMPH/VASCULAR INVASION IS NOT IDENTIFIED. - SEE ONCOLOGY TEMPLATE. ADDITIONAL FINDINGS: - BENIGN CERVIX. - BENIGN BILATERAL OVARIES. - BENIGN BILATERAL FALLOPIAN TUBES. 2. Lymph nodes, regional resection, right pelvic - FIVE BENIGN LYMPH NODES WITH NO TUMOR SEEN (0/5). 3. Lymph nodes, regional resection, left pelvic - THREE BENIGN LYMPH NODES WITH NO TUMOR SEEN (0/3).  Interval History: She's overall doing quite well. She really has a completely negative review of systems. She's not very physically active and admits that she could be. From about a physical perspective as well as having a time during the day she believes she can be more physically active. I last saw her in May 2015. At that time there is what appear to be granulation tissue at the top of the vaginal cuff. Biopsy was performed and was consistent with granulation tissue. There is no evidence of recurrent disease.  She's had a fairly eventful year. She's had tubes in her ears and has follow-up with her ENT this week. She believes that she is going to have them taken out. In October of last year she fell and suffered a compound fracture of her right arm and has a plate. She's got good range of motion and no issues in that arm currently. She did see Dr. Dellis Filbert 6 months ago and had a Pap smear that was negative at that time. She does complain of occasional constipation. She's been having some reflux and had an upper endoscopy performed that was unremarkable. She is walking about 08-6998 steps a day. There are no new  medical problems and her family. She is up-to-date on her mammograms.  Review of Systems  Constitutional: Denies fever. + weight loss 7 # intentional Skin: No rash, sores  Cardiovascular: No chest pain, shortness of breath, or edema  Pulmonary: No cough or wheeze.  Gastro Intestinal: No nausea, vomiting,  constipation, or diarrhea reported. No bright red blood per rectum or change in bowel movement.  Genitourinary: No frequency, urgency, or dysuria.  Denies vaginal bleeding and discharge.  Musculoskeletal: No myalgia, arthralgia, joint swelling or pain.  Neurologic: No weakness, numbness, or change in gait.  Psychology: No changes   Current Meds:  Outpatient Encounter Prescriptions as of 08/11/2014  Medication Sig  . aspirin 81 MG tablet Take 81 mg by mouth every evening.   Marland Kitchen CARAFATE 1 GM/10ML suspension as needed.   . celecoxib (CELEBREX) 200 MG capsule Take 200 mg by mouth daily as needed for mild pain.  Marland Kitchen DEXILANT 60 MG capsule Take 60 mg by mouth daily.   Marland Kitchen ezetimibe (ZETIA) 10 MG tablet Take 10 mg by mouth at bedtime.  Marland Kitchen ibuprofen (ADVIL,MOTRIN) 200 MG tablet Take 100-200 mg by mouth every 6 (six) hours as needed for moderate pain.   Marland Kitchen levothyroxine (SYNTHROID, LEVOTHROID) 112 MCG tablet Take 112 mcg by mouth daily before breakfast. No Substitutions " brand name Synthroid only"  . losartan-hydrochlorothiazide (HYZAAR) 100-25 MG per tablet Take 0.5 tablets by mouth daily.   . Probiotic Product (ALIGN) 4 MG CAPS Take 1 capsule by mouth at bedtime.   . rosuvastatin (CRESTOR) 5 MG tablet Take 2.5 mg by mouth every other day.  . oxyCODONE-acetaminophen (ROXICET) 5-325 MG per tablet Take 1 tablet by mouth every 4 (four) hours as needed for severe pain. (Patient not taking: Reported on 08/11/2014)  . [DISCONTINUED] Influenza Vac Split Quad (FLUZONE) 0.25 ML injection Inject 0.25 mLs into the muscle once.  . [DISCONTINUED] losartan-hydrochlorothiazide (HYZAAR) 50-12.5 MG per tablet Take 1 tablet by mouth 2 (two) times daily. One tablet in am, and one tablet in pm.  . [DISCONTINUED] omeprazole (PRILOSEC) 40 MG capsule Take 40 mg by mouth 2 (two) times daily. Am and pm   No facility-administered encounter medications on file as of 08/11/2014.    Allergy:  Allergies  Allergen Reactions  .  Altace [Ramipril] Hives  . Tussicaps [Hydrocod Polst-Cpm Polst Er] Rash  . Zithromax [Azithromycin] Rash    Social Hx:   History   Social History  . Marital Status: Married    Spouse Name: N/A  . Number of Children: N/A  . Years of Education: N/A   Occupational History  . Not on file.   Social History Main Topics  . Smoking status: Former Smoker -- 12 years    Start date: 03/26/1974    Quit date: 03/26/1988  . Smokeless tobacco: Never Used  . Alcohol Use: 0.0 oz/week     Comment: rare occ " 3-4 times a year"   . Drug Use: No  . Sexual Activity: Not Currently   Other Topics Concern  . Not on file   Social History Narrative    Past Surgical Hx:  Past Surgical History  Procedure Laterality Date  . Laparoscopic cholecystectomy  2000?  Marland Kitchen Left knee surg  2009    "Torn meniscus"   . Tonsilectomy, adenoidectomy, bilateral myringotomy and tubes  1955  . Robotic assisted total hysterectomy with bilateral salpingo oopherectomy Bilateral 01/20/2013    Procedure: ROBOTIC ASSISTED TOTAL ABDOMINAL HYSTERECTOMY WITH BILATERAL SALPINGO OOPHORECTOMY PELVIC LYMPH NODE  DISSECTION;  Surgeon: Lucita Lora. Alycia Rossetti, MD;  Location: [facility];  Service: Gynecology;  Laterality: Bilateral;  . Orif ulnar fracture Right 12/28/2013    Procedure: OPEN REDUCTION INTERNAL FIXATION (ORIF) ULNAR AND RADIAL FRACTURE;  Surgeon: Charlotte Crumb, MD;  Location: [facility];  Service: Orthopedics;  Laterality: Right;    Past Medical Hx:  Past Medical History  Diagnosis Date  . Hypertension   . Thyroid disease   . GERD (gastroesophageal reflux disease)   . PONV (postoperative nausea and vomiting)   . Hypothyroidism   . Headache(784.0)     migraines "cluster headaches"-most days  . Cataract     not surgical yet   diverticulosis  Past Gynecological History: Gravida 4 para 4 last normal menstrual period in her early 22s menarche at age 21 with regular menses reports oral contraceptive pill use intermittently over  10 years   No LMP recorded. Patient has had a hysterectomy. Denies any history of abnormal Pap test NSVD x4  Family Hx:  Family History  Problem Relation Age of Onset  . Cancer Mother   . Cancer Sister   . Cancer Brother    her mother was diagnosed leiomyosarcoma presumed of lung origin at age of 72. Sister diagnosed with lung cancer the age of 22 brother diagnosed with prostate cancer in his 41s    Vitals:  Blood pressure 125/68, pulse 71, temperature 97.6 F (36.4 C), temperature source Oral, resp. rate 16, height 5' 5.16" (1.655 m), weight 288 lb 11.2 oz (130.953 kg), SpO2 92 %. Body mass index is 47.81 kg/(m^2).  Physical Exam: WD in NAD  Alert and oriented to person, place, and time appropriate speech mood and affect  Neck: Supple, no lymphadenopathy, no thyromegaly.  Lungs: Distant breath sounds clear to auscultation.  Cardiovascular: Regular rate and rhythm with distant heart sounds.    Abdomen: Well-healed surgical incisions. Abdomen is soft and nontender. No palpable masses or hepatosplenomegaly but exam is limited by habitus.  Extremities: 1+ nonpitting edema equal bilaterally. Well healed incision on right forearm.  Pelvic: Normal external female genitalia. The vagina is atrophic. The vaginal cuff is difficult to visualize secondary to patient habitus. However there is no gross visible lesions. Vagina is atrophic in appearance. Bimanual examination reveals no masses or nodularity. There's a large amount of stool in the rectum. Rectovaginal examination was deferred.     Raney Koeppen A., MD 08/11/2014, 9:52 AM

## 2014-08-12 ENCOUNTER — Ambulatory Visit: Payer: Self-pay | Admitting: Gynecologic Oncology

## 2014-08-17 DIAGNOSIS — H6523 Chronic serous otitis media, bilateral: Secondary | ICD-10-CM | POA: Diagnosis not present

## 2014-08-17 DIAGNOSIS — H9213 Otorrhea, bilateral: Secondary | ICD-10-CM | POA: Diagnosis not present

## 2014-10-15 DIAGNOSIS — Z1231 Encounter for screening mammogram for malignant neoplasm of breast: Secondary | ICD-10-CM | POA: Diagnosis not present

## 2014-10-21 DIAGNOSIS — E032 Hypothyroidism due to medicaments and other exogenous substances: Secondary | ICD-10-CM | POA: Diagnosis not present

## 2014-10-21 DIAGNOSIS — Z0000 Encounter for general adult medical examination without abnormal findings: Secondary | ICD-10-CM | POA: Diagnosis not present

## 2014-10-21 DIAGNOSIS — I10 Essential (primary) hypertension: Secondary | ICD-10-CM | POA: Diagnosis not present

## 2014-10-21 DIAGNOSIS — E789 Disorder of lipoprotein metabolism, unspecified: Secondary | ICD-10-CM | POA: Diagnosis not present

## 2014-10-21 DIAGNOSIS — E559 Vitamin D deficiency, unspecified: Secondary | ICD-10-CM | POA: Diagnosis not present

## 2014-10-28 DIAGNOSIS — E789 Disorder of lipoprotein metabolism, unspecified: Secondary | ICD-10-CM | POA: Diagnosis not present

## 2014-10-28 DIAGNOSIS — I10 Essential (primary) hypertension: Secondary | ICD-10-CM | POA: Diagnosis not present

## 2014-10-28 DIAGNOSIS — E039 Hypothyroidism, unspecified: Secondary | ICD-10-CM | POA: Diagnosis not present

## 2014-10-28 DIAGNOSIS — E669 Obesity, unspecified: Secondary | ICD-10-CM | POA: Diagnosis not present

## 2014-11-02 DIAGNOSIS — H5213 Myopia, bilateral: Secondary | ICD-10-CM | POA: Diagnosis not present

## 2014-11-02 DIAGNOSIS — H25013 Cortical age-related cataract, bilateral: Secondary | ICD-10-CM | POA: Diagnosis not present

## 2014-11-02 DIAGNOSIS — H52203 Unspecified astigmatism, bilateral: Secondary | ICD-10-CM | POA: Diagnosis not present

## 2014-12-02 DIAGNOSIS — H902 Conductive hearing loss, unspecified: Secondary | ICD-10-CM | POA: Diagnosis not present

## 2014-12-02 DIAGNOSIS — H6523 Chronic serous otitis media, bilateral: Secondary | ICD-10-CM | POA: Diagnosis not present

## 2014-12-08 DIAGNOSIS — H900 Conductive hearing loss, bilateral: Secondary | ICD-10-CM | POA: Diagnosis not present

## 2014-12-08 DIAGNOSIS — H6523 Chronic serous otitis media, bilateral: Secondary | ICD-10-CM | POA: Diagnosis not present

## 2014-12-08 DIAGNOSIS — H902 Conductive hearing loss, unspecified: Secondary | ICD-10-CM | POA: Diagnosis not present

## 2014-12-17 DIAGNOSIS — Z23 Encounter for immunization: Secondary | ICD-10-CM | POA: Diagnosis not present

## 2014-12-20 ENCOUNTER — Other Ambulatory Visit: Payer: Self-pay | Admitting: Otolaryngology

## 2014-12-20 DIAGNOSIS — G960 Cerebrospinal fluid leak: Principal | ICD-10-CM

## 2014-12-20 DIAGNOSIS — G9601 Cranial cerebrospinal fluid leak, spontaneous: Secondary | ICD-10-CM

## 2014-12-27 ENCOUNTER — Ambulatory Visit
Admission: RE | Admit: 2014-12-27 | Discharge: 2014-12-27 | Disposition: A | Discharge status: Home / Self Care | Payer: Medicare Other | Source: Ambulatory Visit | Attending: Otolaryngology | Admitting: Otolaryngology

## 2014-12-27 DIAGNOSIS — G960 Cerebrospinal fluid leak: Principal | ICD-10-CM

## 2014-12-27 DIAGNOSIS — I10 Essential (primary) hypertension: Secondary | ICD-10-CM | POA: Diagnosis not present

## 2014-12-27 DIAGNOSIS — H9210 Otorrhea, unspecified ear: Secondary | ICD-10-CM | POA: Diagnosis not present

## 2014-12-27 DIAGNOSIS — G9601 Cranial cerebrospinal fluid leak, spontaneous: Secondary | ICD-10-CM

## 2014-12-27 MED ORDER — IOPAMIDOL (ISOVUE-300) INJECTION 61%
75.0000 mL | Freq: Once | INTRAVENOUS | Status: AC | PRN
  Administered 2014-12-27: 75 mL via INTRAVENOUS

## 2015-01-25 DIAGNOSIS — H900 Conductive hearing loss, bilateral: Secondary | ICD-10-CM | POA: Diagnosis not present

## 2015-01-25 DIAGNOSIS — G960 Cerebrospinal fluid leak: Secondary | ICD-10-CM | POA: Diagnosis not present

## 2015-01-25 DIAGNOSIS — H9213 Otorrhea, bilateral: Secondary | ICD-10-CM | POA: Diagnosis not present

## 2015-01-25 DIAGNOSIS — H9313 Tinnitus, bilateral: Secondary | ICD-10-CM | POA: Diagnosis not present

## 2015-01-25 DIAGNOSIS — H9190 Unspecified hearing loss, unspecified ear: Secondary | ICD-10-CM | POA: Diagnosis not present

## 2015-02-28 DIAGNOSIS — Z01419 Encounter for gynecological examination (general) (routine) without abnormal findings: Secondary | ICD-10-CM | POA: Diagnosis not present

## 2015-02-28 DIAGNOSIS — C549 Malignant neoplasm of corpus uteri, unspecified: Secondary | ICD-10-CM | POA: Diagnosis not present

## 2015-04-25 DIAGNOSIS — E789 Disorder of lipoprotein metabolism, unspecified: Secondary | ICD-10-CM | POA: Diagnosis not present

## 2015-04-25 DIAGNOSIS — E559 Vitamin D deficiency, unspecified: Secondary | ICD-10-CM | POA: Diagnosis not present

## 2015-04-25 DIAGNOSIS — I10 Essential (primary) hypertension: Secondary | ICD-10-CM | POA: Diagnosis not present

## 2015-05-02 DIAGNOSIS — I10 Essential (primary) hypertension: Secondary | ICD-10-CM | POA: Diagnosis not present

## 2015-05-02 DIAGNOSIS — E789 Disorder of lipoprotein metabolism, unspecified: Secondary | ICD-10-CM | POA: Diagnosis not present

## 2015-06-06 DIAGNOSIS — R05 Cough: Secondary | ICD-10-CM | POA: Diagnosis not present

## 2015-06-06 DIAGNOSIS — I10 Essential (primary) hypertension: Secondary | ICD-10-CM | POA: Diagnosis not present

## 2015-06-06 DIAGNOSIS — E032 Hypothyroidism due to medicaments and other exogenous substances: Secondary | ICD-10-CM | POA: Diagnosis not present

## 2015-07-06 ENCOUNTER — Encounter: Payer: Self-pay | Admitting: Gynecologic Oncology

## 2015-07-06 ENCOUNTER — Ambulatory Visit: Payer: Medicare Other | Attending: Gynecologic Oncology | Admitting: Gynecologic Oncology

## 2015-07-06 VITALS — BP 150/63 | HR 64 | Temp 97.4°F | Resp 18 | Ht 65.16 in | Wt 293.5 lb

## 2015-07-06 DIAGNOSIS — K219 Gastro-esophageal reflux disease without esophagitis: Secondary | ICD-10-CM | POA: Diagnosis not present

## 2015-07-06 DIAGNOSIS — E079 Disorder of thyroid, unspecified: Secondary | ICD-10-CM | POA: Insufficient documentation

## 2015-07-06 DIAGNOSIS — I10 Essential (primary) hypertension: Secondary | ICD-10-CM | POA: Insufficient documentation

## 2015-07-06 DIAGNOSIS — Z7982 Long term (current) use of aspirin: Secondary | ICD-10-CM | POA: Insufficient documentation

## 2015-07-06 DIAGNOSIS — N952 Postmenopausal atrophic vaginitis: Secondary | ICD-10-CM | POA: Insufficient documentation

## 2015-07-06 DIAGNOSIS — Z8542 Personal history of malignant neoplasm of other parts of uterus: Secondary | ICD-10-CM | POA: Diagnosis not present

## 2015-07-06 DIAGNOSIS — M1712 Unilateral primary osteoarthritis, left knee: Secondary | ICD-10-CM | POA: Diagnosis not present

## 2015-07-06 DIAGNOSIS — C541 Malignant neoplasm of endometrium: Secondary | ICD-10-CM | POA: Diagnosis not present

## 2015-07-06 DIAGNOSIS — Z87891 Personal history of nicotine dependence: Secondary | ICD-10-CM | POA: Insufficient documentation

## 2015-07-06 DIAGNOSIS — E039 Hypothyroidism, unspecified: Secondary | ICD-10-CM | POA: Diagnosis not present

## 2015-07-06 DIAGNOSIS — Z79899 Other long term (current) drug therapy: Secondary | ICD-10-CM | POA: Insufficient documentation

## 2015-07-06 NOTE — Progress Notes (Signed)
Consult Note: Gyn-Onc   CC:  Chief Complaint  Patient presents with  . endomertrial cancer    MD follow up visit    Assessment/Plan:  [name]  is a 70 y.o.  year old with a 1A grade 1 endometrioid adenocarcinoma with no lymphovascular space involvement. She does not require any adjuvant therapy. She return to see [place] in 12 months and will see Dr. Dellis Filbert in 6 months.   HPI: [name]  is a 71 y.o. gravida 4 para 4 last normal menstrual period in her early 6s. The patient presented with reports of hematuria specifically bloody mucous discharge within her urine over the last 9 months. A workup was undertaken and that included an MRI which demonstrated a thickened irregular endometrium in the fundus measuring up to 2 cm diverticulosis of the colon was noted the adnexa appeared normal. The rectal remnant was noted that there were no findings of Eure, bladder cancer. Abdominal MRI was unremarkable and a CT scan did not add anything additional. She underwent cystoscopy and that was within normal limits. Underwent an endometrial biopsy on 12/18/2012 which demonstrated a FIGO grade 1 endometrial adenocarcinoma.  Pap negative HPV negative. A colonoscopy on August of 2014 was notable for the presence of a benign polyp.  She underwent a total robotic hysterectomy bilateral pelvic lymph node dissection and bilateral salpingo-oophorectomy 01/20/2013. Operative findings included physiologic adhesions of the left colon to the left pelvic sidewall, no gross lymphadenopathy. The uterus and adnexa were normal bilaterally. There is significant drop to, no adipose tissue. Frozen section revealed a grade 1 tumor with no more than 50% myometrial invasion. Final pathology revealed:  1. Uterus +/- tubes/ovaries, neoplastic - INVASIVE GRADE I ENDOMETRIAL ADENOCARCINOMA, ENDOMETRIOID TYPE. - INVASIVE TUMOR CLOSELY APPROACHES MIDDLE HALF OF ENDOMYOMETRIAL SECTION ALTHOUGH IS ULTIMATELY  CONFINED TO INNER HALF. - DEFINITIVE LYMPH/VASCULAR INVASION IS NOT IDENTIFIED. - SEE ONCOLOGY TEMPLATE. ADDITIONAL FINDINGS: - BENIGN CERVIX. - BENIGN BILATERAL OVARIES. - BENIGN BILATERAL FALLOPIAN TUBES. 2. Lymph nodes, regional resection, right pelvic - FIVE BENIGN LYMPH NODES WITH NO TUMOR SEEN (0/5). 3. Lymph nodes, regional resection, left pelvic - THREE BENIGN LYMPH NODES WITH NO TUMOR SEEN (0/3).  Interval History: She's overall doing quite well. She really has a completely negative review of systems. She's not very physically active and admits that she could be. From about a physical perspective as well as having a time during the day she believes she can be more physically active.  I last saw her in May 2016. At that point her exam was unremarkable. She saw Dr. Dellis Filbert in the interim. She comes in today for follow-up. She is overall doing quite well. She has gained approximately 5 pounds since last year. She continues to walk about 5 or 6000 steps per day. She's been limited somewhat by left knee pain due to arthritis. She states she sees her orthopedic surgeon in the spring every year typically gets a cortisone injection once a year. She is up-to-date on her mammograms. She is not due for colonoscopy in 2019. She and her husband recently visited their children in [place]. She's got 2 vacations to the beach this summer.  Review of Systems  Constitutional: Denies fever.  Skin: No rash, sores  Cardiovascular: No chest pain, shortness of breath, or edema  Pulmonary: No cough or wheeze.  Gastro Intestinal: No nausea, vomiting, constipation, or diarrhea reported. No change in bowel movement.  Genitourinary: No frequency, urgency, or dysuria.  Denies vaginal  bleeding and discharge.  Musculoskeletal: + Left knee pain Neurologic: No weakness, numbness, or change in gait.  Psychology: No changes  Current Meds:  Outpatient Encounter Prescriptions as of 07/06/2015  Medication Sig  .  aspirin 81 MG tablet Take 81 mg by mouth every evening.   Marland Kitchen CARAFATE 1 GM/10ML suspension as needed.   . celecoxib (CELEBREX) 200 MG capsule Take 200 mg by mouth daily as needed for mild pain.  . Cholecalciferol (D 10000) 10000 units CAPS Take by mouth.  . ezetimibe (ZETIA) 10 MG tablet Take 10 mg by mouth at bedtime.  Marland Kitchen ibuprofen (ADVIL,MOTRIN) 200 MG tablet Take 100-200 mg by mouth every 6 (six) hours as needed for moderate pain.   Marland Kitchen levothyroxine (SYNTHROID, LEVOTHROID) 112 MCG tablet Take 112 mcg by mouth daily before breakfast. No Substitutions " brand name Synthroid only"  . losartan-hydrochlorothiazide (HYZAAR) 100-25 MG per tablet Take 0.5 tablets by mouth daily.   Marland Kitchen omeprazole (PRILOSEC) 40 MG capsule   . Probiotic Product (ALIGN) 4 MG CAPS Take 1 capsule by mouth at bedtime.   . rosuvastatin (CRESTOR) 5 MG tablet Take 2.5 mg by mouth every other day.  . [DISCONTINUED] DEXILANT 60 MG capsule Take 60 mg by mouth daily.   . [DISCONTINUED] oxyCODONE-acetaminophen (ROXICET) 5-325 MG per tablet Take 1 tablet by mouth every 4 (four) hours as needed for severe pain. (Patient not taking: Reported on 08/11/2014)   No facility-administered encounter medications on file as of 07/06/2015.    Allergy:  Allergies  Allergen Reactions  . Altace [Ramipril] Hives  . Tussicaps [Hydrocod Polst-Cpm Polst Er] Rash  . Zithromax [Azithromycin] Rash    Social Hx:   Social History   Social History  . Marital Status: Married    Spouse Name: N/A  . Number of Children: N/A  . Years of Education: N/A   Occupational History  . Not on file.   Social History Main Topics  . Smoking status: Former Smoker -- 12 years    Start date: 03/26/1974    Quit date: 03/26/1988  . Smokeless tobacco: Never Used  . Alcohol Use: 0.0 oz/week     Comment: rare occ " 3-4 times a year"   . Drug Use: No  . Sexual Activity: Not Currently   Other Topics Concern  . Not on file   Social History Narrative    Past  Surgical Hx:  Past Surgical History  Procedure Laterality Date  . Laparoscopic cholecystectomy  2000?  Marland Kitchen Left knee surg  2009    "Torn meniscus"   . Tonsilectomy, adenoidectomy, bilateral myringotomy and tubes  1955  . Robotic assisted total hysterectomy with bilateral salpingo oopherectomy Bilateral 01/20/2013    Procedure: ROBOTIC ASSISTED TOTAL ABDOMINAL HYSTERECTOMY WITH BILATERAL SALPINGO OOPHORECTOMY PELVIC LYMPH NODE DISSECTION;  Surgeon: Imagene Gurney A. Alycia Rossetti, MD;  Location: [facility];  Service: Gynecology;  Laterality: Bilateral;  . Orif ulnar fracture Right 12/28/2013    Procedure: OPEN REDUCTION INTERNAL FIXATION (ORIF) ULNAR AND RADIAL FRACTURE;  Surgeon: Charlotte Crumb, MD;  Location: [facility];  Service: Orthopedics;  Laterality: Right;    Past Medical Hx:  Past Medical History  Diagnosis Date  . Hypertension   . Thyroid disease   . GERD (gastroesophageal reflux disease)   . PONV (postoperative nausea and vomiting)   . Hypothyroidism   . Headache(784.0)     migraines "cluster headaches"-most days  . Cataract     not surgical yet   diverticulosis  Past Gynecological History: Gravida 4 para  4 last normal menstrual period in her early 50s menarche at age 16 with regular menses reports oral contraceptive pill use intermittently over 10 years   No LMP recorded. Patient has had a hysterectomy. Denies any history of abnormal Pap test NSVD x4  Family Hx:  Family History  Problem Relation Age of Onset  . Cancer Mother   . Cancer Sister   . Cancer Brother    her mother was diagnosed leiomyosarcoma presumed of lung origin at age of 32. Sister diagnosed with lung cancer the age of 53 brother diagnosed with prostate cancer in his 20s    Vitals:  Blood pressure 150/63, pulse 64, temperature 97.4 F (36.3 C), temperature source Oral, resp. rate 18, height 5' 5.16" (1.655 m), weight 293 lb 8 oz (133.131 kg), SpO2 100 %. Body mass index is 48.61 kg/(m^2).  Physical Exam: WD in  NAD  Alert and oriented to person, place, and time appropriate speech mood and affect  Neck: Supple, no lymphadenopathy, no thyromegaly.  Lungs: Distant breath sounds clear to auscultation.  Cardiovascular: Regular rate and rhythm with distant heart sounds.    Abdomen: Well-healed surgical incisions. Abdomen is soft and nontender. No palpable masses or hepatosplenomegaly but exam is limited by habitus. Large pannus. No skin changes under the pannus.  Extremities: 1+ nonpitting edema equal bilaterally. Well healed incision on right forearm.  Groins: No lymphadenopathy but exam is limited by habitus.  Pelvic: Normal external female genitalia. The vagina is atrophic. The vaginal cuff is difficult to visualize secondary to patient habitus. However there is no gross visible lesions. Vagina is atrophic in appearance. Bimanual examination reveals no masses or nodularity. Rectovaginal examination confirms.   Tayllor Breitenstein A., MD 07/06/2015, 11:19 AM

## 2015-07-06 NOTE — Patient Instructions (Signed)
Please return to see [place] in one year in follow-up with Dr. Dellis Filbert in 6 months.

## 2015-08-06 DIAGNOSIS — M1712 Unilateral primary osteoarthritis, left knee: Secondary | ICD-10-CM | POA: Diagnosis not present

## 2015-12-09 DIAGNOSIS — Z23 Encounter for immunization: Secondary | ICD-10-CM | POA: Diagnosis not present

## 2016-01-04 DIAGNOSIS — E789 Disorder of lipoprotein metabolism, unspecified: Secondary | ICD-10-CM | POA: Diagnosis not present

## 2016-01-04 DIAGNOSIS — I10 Essential (primary) hypertension: Secondary | ICD-10-CM | POA: Diagnosis not present

## 2016-01-04 DIAGNOSIS — E032 Hypothyroidism due to medicaments and other exogenous substances: Secondary | ICD-10-CM | POA: Diagnosis not present

## 2016-01-11 DIAGNOSIS — I10 Essential (primary) hypertension: Secondary | ICD-10-CM | POA: Diagnosis not present

## 2016-01-11 DIAGNOSIS — M79673 Pain in unspecified foot: Secondary | ICD-10-CM | POA: Diagnosis not present

## 2016-01-24 DIAGNOSIS — M1712 Unilateral primary osteoarthritis, left knee: Secondary | ICD-10-CM | POA: Diagnosis not present

## 2016-02-14 DIAGNOSIS — R05 Cough: Secondary | ICD-10-CM | POA: Diagnosis not present

## 2016-03-06 DIAGNOSIS — Z01419 Encounter for gynecological examination (general) (routine) without abnormal findings: Secondary | ICD-10-CM | POA: Diagnosis not present

## 2016-03-23 ENCOUNTER — Other Ambulatory Visit: Payer: Self-pay | Admitting: Pharmacist

## 2016-03-23 NOTE — Patient Outreach (Signed)
Outreach call to [facility] regarding medication adherence to Livalo 2mg  tablets. Patient reports that she is taking Livalo as directed and that her provider gave her Livalo samples.  Patient reports no further medication questions/concerns at this time.  Harlow Asa, PharmD, [facility] [phone]

## 2016-05-08 DIAGNOSIS — I10 Essential (primary) hypertension: Secondary | ICD-10-CM | POA: Diagnosis not present

## 2016-05-08 DIAGNOSIS — Z0000 Encounter for general adult medical examination without abnormal findings: Secondary | ICD-10-CM | POA: Diagnosis not present

## 2016-05-15 DIAGNOSIS — E789 Disorder of lipoprotein metabolism, unspecified: Secondary | ICD-10-CM | POA: Diagnosis not present

## 2016-05-15 DIAGNOSIS — E032 Hypothyroidism due to medicaments and other exogenous substances: Secondary | ICD-10-CM | POA: Diagnosis not present

## 2016-05-15 DIAGNOSIS — I10 Essential (primary) hypertension: Secondary | ICD-10-CM | POA: Diagnosis not present

## 2016-06-27 DIAGNOSIS — R05 Cough: Secondary | ICD-10-CM | POA: Diagnosis not present

## 2016-06-27 DIAGNOSIS — E789 Disorder of lipoprotein metabolism, unspecified: Secondary | ICD-10-CM | POA: Diagnosis not present

## 2016-06-27 DIAGNOSIS — I10 Essential (primary) hypertension: Secondary | ICD-10-CM | POA: Diagnosis not present

## 2016-06-27 DIAGNOSIS — L0390 Cellulitis, unspecified: Secondary | ICD-10-CM | POA: Diagnosis not present

## 2016-07-10 DIAGNOSIS — M25551 Pain in right hip: Secondary | ICD-10-CM | POA: Diagnosis not present

## 2016-07-10 DIAGNOSIS — M549 Dorsalgia, unspecified: Secondary | ICD-10-CM | POA: Diagnosis not present

## 2016-07-10 DIAGNOSIS — N390 Urinary tract infection, site not specified: Secondary | ICD-10-CM | POA: Diagnosis not present

## 2016-07-10 DIAGNOSIS — M79606 Pain in leg, unspecified: Secondary | ICD-10-CM | POA: Diagnosis not present

## 2016-07-10 DIAGNOSIS — M545 Low back pain: Secondary | ICD-10-CM | POA: Diagnosis not present

## 2016-07-10 DIAGNOSIS — I10 Essential (primary) hypertension: Secondary | ICD-10-CM | POA: Diagnosis not present

## 2016-07-17 DIAGNOSIS — M549 Dorsalgia, unspecified: Secondary | ICD-10-CM | POA: Diagnosis not present

## 2016-07-17 NOTE — Progress Notes (Signed)
Consult Note: Gyn-Onc   CC:  Chief Complaint  Patient presents with  . Endometrial Cancer    Assessment/Plan:  [name]  is a 71 y.o.  year old with a 1A grade 1 endometrioid adenocarcinoma with no lymphovascular space involvement s/p definitive surgery 10/14. She did not require any adjuvant therapy. She will return to see [place] in 12 months and will see Dr. Dellis Filbert in 6 months.   HPI: [name]  is a 71 y.o. gravida 4 para 4 last normal menstrual period in her early 65s. The patient presented with reports of hematuria specifically bloody mucous discharge within her urine over the last 9 months. A workup was undertaken and that included an MRI which demonstrated a thickened irregular endometrium in the fundus measuring up to 2 cm diverticulosis of the colon was noted the adnexa appeared normal. The rectal remnant was noted that there were no findings of Eure, bladder cancer. Abdominal MRI was unremarkable and a CT scan did not add anything additional. She underwent cystoscopy and that was within normal limits. Underwent an endometrial biopsy on 12/18/2012 which demonstrated a FIGO grade 1 endometrial adenocarcinoma.  Pap negative HPV negative. A colonoscopy on August of 2014 was notable for the presence of a benign polyp.  She underwent a total robotic hysterectomy bilateral pelvic lymph node dissection and bilateral salpingo-oophorectomy 01/20/2013. Operative findings included physiologic adhesions of the left colon to the left pelvic sidewall, no gross lymphadenopathy. The uterus and adnexa were normal bilaterally. There is significant drop to, no adipose tissue. Frozen section revealed a grade 1 tumor with no more than 50% myometrial invasion. Final pathology revealed:  1. Uterus +/- tubes/ovaries, neoplastic - INVASIVE GRADE I ENDOMETRIAL ADENOCARCINOMA, ENDOMETRIOID TYPE. - INVASIVE TUMOR CLOSELY APPROACHES MIDDLE HALF OF ENDOMYOMETRIAL SECTION ALTHOUGH IS  ULTIMATELY CONFINED TO INNER HALF. - DEFINITIVE LYMPH/VASCULAR INVASION IS NOT IDENTIFIED. - SEE ONCOLOGY TEMPLATE. ADDITIONAL FINDINGS: - BENIGN CERVIX. - BENIGN BILATERAL OVARIES. - BENIGN BILATERAL FALLOPIAN TUBES. 2. Lymph nodes, regional resection, right pelvic - FIVE BENIGN LYMPH NODES WITH NO TUMOR SEEN (0/5). 3. Lymph nodes, regional resection, left pelvic - THREE BENIGN LYMPH NODES WITH NO TUMOR SEEN (0/3).  Interval History: She's overall doing quite well.  I last saw her in April 2017.  At that point her exam was unremarkable. She saw Dr. Dellis Filbert in the interim and had a negative exam and Pap smear.. She comes in today for follow-up. She is due for a colonoscopy in 2019. She really has no complaints with the exception of a pinched right sciatic nerve. She was having significant pain on the right side that radiated down her leg but she was prescribed steroids and pain medications by her primary care physician in the discomfort is improved. There is a referral for her to see orthopedics. She is up-to-date on her mammograms. There are no new medical problems and her family. She recently said sometime in the [place] with her grandson will his parents were fishing in [place].  Review of Systems  Constitutional: Denies fever.  Skin: No rash, sores  Cardiovascular: No chest pain, shortness of breath, or edema  Pulmonary: No cough or wheeze.  Gastro Intestinal: No nausea, vomiting, constipation, or diarrhea reported. No change in bowel movement.  Genitourinary: No frequency, urgency, or dysuria.  Denies vaginal bleeding and discharge.  Musculoskeletal: + right sciatic pain Neurologic: No weakness, numbness, or change in gait.  Psychology: No changes  Current Meds:  Outpatient Encounter Prescriptions as of  07/18/2016  Medication Sig  . aspirin 81 MG tablet Take 81 mg by mouth every evening.   . celecoxib (CELEBREX) 200 MG capsule Take 200 mg by mouth daily as needed for mild pain.  .  Cholecalciferol (D 10000) 10000 units CAPS Take by mouth.  Marland Kitchen ibuprofen (ADVIL,MOTRIN) 200 MG tablet Take 100-200 mg by mouth every 6 (six) hours as needed for moderate pain.   Marland Kitchen levothyroxine (SYNTHROID, LEVOTHROID) 112 MCG tablet Take 112 mcg by mouth daily before breakfast. No Substitutions " brand name Synthroid only"  . losartan-hydrochlorothiazide (HYZAAR) 100-25 MG per tablet Take 0.5 tablets by mouth daily.   Marland Kitchen omeprazole (PRILOSEC) 40 MG capsule   . Pitavastatin Calcium (LIVALO) 2 MG TABS Take 1 tablet by mouth daily.  . Probiotic Product (ALIGN) 4 MG CAPS Take 1 capsule by mouth at bedtime.   Marland Kitchen CARAFATE 1 GM/10ML suspension as needed.   . [DISCONTINUED] ezetimibe (ZETIA) 10 MG tablet Take 10 mg by mouth at bedtime.   No facility-administered encounter medications on file as of 07/18/2016.     Allergy:  Allergies  Allergen Reactions  . Altace [Ramipril] Hives  . Tussicaps [Hydrocod Polst-Cpm Polst Er] Rash    Social Hx:   Social History   Social History  . Marital status: Married    Spouse name: N/A  . Number of children: N/A  . Years of education: N/A   Occupational History  . Not on file.   Social History Main Topics  . Smoking status: Former Smoker    Years: 12.00    Start date: 03/26/1974    Quit date: 03/26/1988  . Smokeless tobacco: Never Used  . Alcohol use 0.0 oz/week     Comment: rare occ " 3-4 times a year"   . Drug use: No  . Sexual activity: Not Currently   Other Topics Concern  . Not on file   Social History Narrative  . No narrative on file    Past Surgical Hx:  Past Surgical History:  Procedure Laterality Date  . LAPAROSCOPIC CHOLECYSTECTOMY  2000?  Marland Kitchen Left knee surg  2009   "Torn meniscus"   . ORIF ULNAR FRACTURE Right 12/28/2013   Procedure: OPEN REDUCTION INTERNAL FIXATION (ORIF) ULNAR AND RADIAL FRACTURE;  Surgeon: Charlotte Crumb, MD;  Location: [facility];  Service: Orthopedics;  Laterality: Right;  . ROBOTIC ASSISTED TOTAL HYSTERECTOMY WITH  BILATERAL SALPINGO OOPHERECTOMY Bilateral 01/20/2013   Procedure: ROBOTIC ASSISTED TOTAL ABDOMINAL HYSTERECTOMY WITH BILATERAL SALPINGO OOPHORECTOMY PELVIC LYMPH NODE DISSECTION;  Surgeon: Imagene Gurney A. Alycia Rossetti, MD;  Location: [facility];  Service: Gynecology;  Laterality: Bilateral;  . TONSILECTOMY, ADENOIDECTOMY, BILATERAL MYRINGOTOMY AND TUBES  1955    Past Medical Hx:  Past Medical History:  Diagnosis Date  . Cataract    not surgical yet  . GERD (gastroesophageal reflux disease)   . Headache(784.0)    migraines "cluster headaches"-most days  . Hypertension   . Hypothyroidism   . PONV (postoperative nausea and vomiting)   . Thyroid disease    diverticulosis  Past Gynecological History: Gravida 4 para 4 last normal menstrual period in her early 55s menarche at age 41 with regular menses reports oral contraceptive pill use intermittently over 10 years   No LMP recorded. Patient has had a hysterectomy. Denies any history of abnormal Pap test NSVD x4  Family Hx:  Family History  Problem Relation Age of Onset  . Cancer Mother   . Cancer Sister   . Cancer Brother    her  mother was diagnosed leiomyosarcoma presumed of lung origin at age of 56. Sister diagnosed with lung cancer the age of 72 brother diagnosed with prostate cancer in his 85s    Vitals:  Blood pressure 129/65, pulse 63, temperature 97.5 F (36.4 C), temperature source Oral, resp. rate 18, weight 289 lb 6.4 oz (131.3 kg). Body mass index is 47.92 kg/m.  Physical Exam: WD in NAD  Alert and oriented to person, place, and time appropriate speech mood and affect  Neck: Supple, no lymphadenopathy, no thyromegaly.  Lungs: Distant breath sounds clear to auscultation.  Cardiovascular: Regular rate and rhythm with distant heart sounds.    Abdomen: Well-healed surgical incisions. Abdomen is soft and nontender. No palpable masses or hepatosplenomegaly but exam is limited by habitus. Large pannus. No skin changes under the  pannus.  Extremities: 1+ nonpitting edema equal bilaterally. Well healed incision on right forearm.  Groins: No lymphadenopathy but exam is limited by habitus.  Pelvic: Normal external female genitalia. The vagina is atrophic. The vaginal cuff is difficult to visualize secondary to patient habitus. However there is no gross visible lesions. Vagina is atrophic in appearance. Bimanual examination reveals no masses or nodularity. Rectovaginal examination confirms.   Deissy Guilbert A., MD 07/18/2016, 10:25 AM

## 2016-07-18 ENCOUNTER — Encounter: Payer: Self-pay | Admitting: Gynecologic Oncology

## 2016-07-18 ENCOUNTER — Ambulatory Visit: Payer: Medicare Other | Attending: Gynecologic Oncology | Admitting: Gynecologic Oncology

## 2016-07-18 VITALS — BP 129/65 | HR 63 | Temp 97.5°F | Resp 18 | Wt 289.4 lb

## 2016-07-18 DIAGNOSIS — I10 Essential (primary) hypertension: Secondary | ICD-10-CM | POA: Insufficient documentation

## 2016-07-18 DIAGNOSIS — Z79899 Other long term (current) drug therapy: Secondary | ICD-10-CM | POA: Insufficient documentation

## 2016-07-18 DIAGNOSIS — Z90710 Acquired absence of both cervix and uterus: Secondary | ICD-10-CM | POA: Diagnosis not present

## 2016-07-18 DIAGNOSIS — Z87891 Personal history of nicotine dependence: Secondary | ICD-10-CM | POA: Diagnosis not present

## 2016-07-18 DIAGNOSIS — Z8542 Personal history of malignant neoplasm of other parts of uterus: Secondary | ICD-10-CM

## 2016-07-18 DIAGNOSIS — C541 Malignant neoplasm of endometrium: Secondary | ICD-10-CM | POA: Diagnosis not present

## 2016-07-18 DIAGNOSIS — Z7982 Long term (current) use of aspirin: Secondary | ICD-10-CM | POA: Diagnosis not present

## 2016-07-18 DIAGNOSIS — Z9889 Other specified postprocedural states: Secondary | ICD-10-CM | POA: Insufficient documentation

## 2016-07-18 DIAGNOSIS — K219 Gastro-esophageal reflux disease without esophagitis: Secondary | ICD-10-CM | POA: Insufficient documentation

## 2016-07-18 DIAGNOSIS — E039 Hypothyroidism, unspecified: Secondary | ICD-10-CM | POA: Insufficient documentation

## 2016-07-18 NOTE — Patient Instructions (Signed)
Return to GYN Oncology in one year and follow up with your gynecologist in 6 months.

## 2016-07-20 DIAGNOSIS — L0390 Cellulitis, unspecified: Secondary | ICD-10-CM | POA: Diagnosis not present

## 2016-07-25 DIAGNOSIS — M5416 Radiculopathy, lumbar region: Secondary | ICD-10-CM | POA: Diagnosis not present

## 2016-07-25 DIAGNOSIS — M5431 Sciatica, right side: Secondary | ICD-10-CM | POA: Diagnosis not present

## 2016-07-31 DIAGNOSIS — M25551 Pain in right hip: Secondary | ICD-10-CM | POA: Diagnosis not present

## 2016-07-31 DIAGNOSIS — M5416 Radiculopathy, lumbar region: Secondary | ICD-10-CM | POA: Diagnosis not present

## 2016-07-31 DIAGNOSIS — M5431 Sciatica, right side: Secondary | ICD-10-CM | POA: Diagnosis not present

## 2016-08-03 DIAGNOSIS — M25551 Pain in right hip: Secondary | ICD-10-CM | POA: Diagnosis not present

## 2016-08-03 DIAGNOSIS — S32591A Other specified fracture of right pubis, initial encounter for closed fracture: Secondary | ICD-10-CM | POA: Diagnosis not present

## 2016-08-03 DIAGNOSIS — M5416 Radiculopathy, lumbar region: Secondary | ICD-10-CM | POA: Diagnosis not present

## 2016-08-23 DIAGNOSIS — Z85828 Personal history of other malignant neoplasm of skin: Secondary | ICD-10-CM | POA: Diagnosis not present

## 2016-08-23 DIAGNOSIS — L271 Localized skin eruption due to drugs and medicaments taken internally: Secondary | ICD-10-CM | POA: Diagnosis not present

## 2016-08-23 DIAGNOSIS — D2230 Melanocytic nevi of unspecified part of face: Secondary | ICD-10-CM | POA: Diagnosis not present

## 2016-08-24 DIAGNOSIS — S32591D Other specified fracture of right pubis, subsequent encounter for fracture with routine healing: Secondary | ICD-10-CM | POA: Diagnosis not present

## 2016-09-05 DIAGNOSIS — I10 Essential (primary) hypertension: Secondary | ICD-10-CM | POA: Diagnosis not present

## 2016-09-05 DIAGNOSIS — E789 Disorder of lipoprotein metabolism, unspecified: Secondary | ICD-10-CM | POA: Diagnosis not present

## 2016-09-08 DIAGNOSIS — E039 Hypothyroidism, unspecified: Secondary | ICD-10-CM | POA: Diagnosis not present

## 2016-09-08 DIAGNOSIS — I10 Essential (primary) hypertension: Secondary | ICD-10-CM | POA: Diagnosis not present

## 2016-09-08 DIAGNOSIS — K121 Other forms of stomatitis: Secondary | ICD-10-CM | POA: Diagnosis not present

## 2016-09-12 DIAGNOSIS — E559 Vitamin D deficiency, unspecified: Secondary | ICD-10-CM | POA: Diagnosis not present

## 2016-09-12 DIAGNOSIS — E789 Disorder of lipoprotein metabolism, unspecified: Secondary | ICD-10-CM | POA: Diagnosis not present

## 2016-09-12 DIAGNOSIS — I10 Essential (primary) hypertension: Secondary | ICD-10-CM | POA: Diagnosis not present

## 2016-09-12 DIAGNOSIS — E032 Hypothyroidism due to medicaments and other exogenous substances: Secondary | ICD-10-CM | POA: Diagnosis not present

## 2016-09-21 DIAGNOSIS — S32591D Other specified fracture of right pubis, subsequent encounter for fracture with routine healing: Secondary | ICD-10-CM | POA: Diagnosis not present

## 2016-10-19 DIAGNOSIS — S32591D Other specified fracture of right pubis, subsequent encounter for fracture with routine healing: Secondary | ICD-10-CM | POA: Diagnosis not present

## 2016-10-19 DIAGNOSIS — M1712 Unilateral primary osteoarthritis, left knee: Secondary | ICD-10-CM | POA: Diagnosis not present

## 2016-11-09 DIAGNOSIS — H353121 Nonexudative age-related macular degeneration, left eye, early dry stage: Secondary | ICD-10-CM | POA: Diagnosis not present

## 2016-11-09 DIAGNOSIS — H35371 Puckering of macula, right eye: Secondary | ICD-10-CM | POA: Diagnosis not present

## 2016-11-09 DIAGNOSIS — H2513 Age-related nuclear cataract, bilateral: Secondary | ICD-10-CM | POA: Diagnosis not present

## 2016-11-09 DIAGNOSIS — H5213 Myopia, bilateral: Secondary | ICD-10-CM | POA: Diagnosis not present

## 2016-11-20 DIAGNOSIS — S32591D Other specified fracture of right pubis, subsequent encounter for fracture with routine healing: Secondary | ICD-10-CM | POA: Diagnosis not present

## 2016-12-10 DIAGNOSIS — M810 Age-related osteoporosis without current pathological fracture: Secondary | ICD-10-CM | POA: Diagnosis not present

## 2016-12-17 DIAGNOSIS — S32591D Other specified fracture of right pubis, subsequent encounter for fracture with routine healing: Secondary | ICD-10-CM | POA: Diagnosis not present

## 2016-12-24 DIAGNOSIS — Z1231 Encounter for screening mammogram for malignant neoplasm of breast: Secondary | ICD-10-CM | POA: Diagnosis not present

## 2017-01-14 DIAGNOSIS — M25552 Pain in left hip: Secondary | ICD-10-CM | POA: Diagnosis not present

## 2017-01-14 DIAGNOSIS — S32591D Other specified fracture of right pubis, subsequent encounter for fracture with routine healing: Secondary | ICD-10-CM | POA: Diagnosis not present

## 2017-01-14 DIAGNOSIS — Z01812 Encounter for preprocedural laboratory examination: Secondary | ICD-10-CM | POA: Diagnosis not present

## 2017-01-21 DIAGNOSIS — M25552 Pain in left hip: Secondary | ICD-10-CM | POA: Diagnosis not present

## 2017-01-28 DIAGNOSIS — M25552 Pain in left hip: Secondary | ICD-10-CM | POA: Diagnosis not present

## 2017-01-28 DIAGNOSIS — S32591D Other specified fracture of right pubis, subsequent encounter for fracture with routine healing: Secondary | ICD-10-CM | POA: Diagnosis not present

## 2017-02-06 DIAGNOSIS — K219 Gastro-esophageal reflux disease without esophagitis: Secondary | ICD-10-CM | POA: Diagnosis not present

## 2017-02-06 DIAGNOSIS — E039 Hypothyroidism, unspecified: Secondary | ICD-10-CM | POA: Diagnosis not present

## 2017-02-06 DIAGNOSIS — M810 Age-related osteoporosis without current pathological fracture: Secondary | ICD-10-CM | POA: Diagnosis not present

## 2017-02-06 DIAGNOSIS — E559 Vitamin D deficiency, unspecified: Secondary | ICD-10-CM | POA: Diagnosis not present

## 2017-02-06 DIAGNOSIS — I10 Essential (primary) hypertension: Secondary | ICD-10-CM | POA: Diagnosis not present

## 2017-02-25 DIAGNOSIS — M25552 Pain in left hip: Secondary | ICD-10-CM | POA: Diagnosis not present

## 2017-02-25 DIAGNOSIS — S32591D Other specified fracture of right pubis, subsequent encounter for fracture with routine healing: Secondary | ICD-10-CM | POA: Diagnosis not present

## 2017-02-28 DIAGNOSIS — E032 Hypothyroidism due to medicaments and other exogenous substances: Secondary | ICD-10-CM | POA: Diagnosis not present

## 2017-02-28 DIAGNOSIS — E559 Vitamin D deficiency, unspecified: Secondary | ICD-10-CM | POA: Diagnosis not present

## 2017-02-28 DIAGNOSIS — E789 Disorder of lipoprotein metabolism, unspecified: Secondary | ICD-10-CM | POA: Diagnosis not present

## 2017-03-07 DIAGNOSIS — E032 Hypothyroidism due to medicaments and other exogenous substances: Secondary | ICD-10-CM | POA: Diagnosis not present

## 2017-03-07 DIAGNOSIS — E789 Disorder of lipoprotein metabolism, unspecified: Secondary | ICD-10-CM | POA: Diagnosis not present

## 2017-03-07 DIAGNOSIS — E559 Vitamin D deficiency, unspecified: Secondary | ICD-10-CM | POA: Diagnosis not present

## 2017-03-07 DIAGNOSIS — I10 Essential (primary) hypertension: Secondary | ICD-10-CM | POA: Diagnosis not present

## 2017-03-07 DIAGNOSIS — M810 Age-related osteoporosis without current pathological fracture: Secondary | ICD-10-CM | POA: Diagnosis not present

## 2017-03-12 ENCOUNTER — Encounter: Payer: Self-pay | Admitting: Obstetrics & Gynecology

## 2017-03-14 ENCOUNTER — Encounter: Payer: Self-pay | Admitting: Obstetrics & Gynecology

## 2017-03-14 ENCOUNTER — Ambulatory Visit: Payer: Medicare Other | Admitting: Obstetrics & Gynecology

## 2017-03-14 VITALS — BP 124/84 | Ht 64.0 in | Wt 274.0 lb

## 2017-03-14 DIAGNOSIS — R1900 Intra-abdominal and pelvic swelling, mass and lump, unspecified site: Secondary | ICD-10-CM | POA: Diagnosis not present

## 2017-03-14 DIAGNOSIS — Z1272 Encounter for screening for malignant neoplasm of vagina: Secondary | ICD-10-CM

## 2017-03-14 DIAGNOSIS — C541 Malignant neoplasm of endometrium: Secondary | ICD-10-CM

## 2017-03-14 DIAGNOSIS — M8589 Other specified disorders of bone density and structure, multiple sites: Secondary | ICD-10-CM | POA: Diagnosis not present

## 2017-03-14 DIAGNOSIS — Z01411 Encounter for gynecological examination (general) (routine) with abnormal findings: Secondary | ICD-10-CM

## 2017-03-14 DIAGNOSIS — Z780 Asymptomatic menopausal state: Secondary | ICD-10-CM | POA: Diagnosis not present

## 2017-03-14 NOTE — Progress Notes (Signed)
Shannon Gardner 02-27-1946 [id number]   History:    71 y.o. G57P4L4  RP: Established patient presenting for annual gyn exam   HPI: Menopause, well on no hormone replacement therapy.  Status post  robotic total laparoscopic hysterectomy with bilateral salpingo-oophorectomy and bilateral pelvic lymph node dissection with Dr. Alycia Rossetti in October 2014.  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  No adjuvant therapy was done.  No pelvic pain.  In the past year she had unexplained pelvic fractures on 2 different occasions.  The fractures were documented by MRI and per patient no evidence of malignancy is suspected as the cause.  She is planning to start on Prolia.  Breasts normal.  Urine and bowel movements normal.  Past medical history,surgical history, family history and social history were all reviewed and documented in the EPIC chart.  Gynecologic History No LMP recorded. Patient has had a hysterectomy. Contraception: status post hysterectomy Last Pap: 2017. Results were: normal Last mammogram: 2017. Results were: normal Colon 2014 No recent Bone Density  Obstetric History OB History  Gravida Para Term Preterm AB Living  4 4       4   SAB TAB Ectopic Multiple Live Births               # Outcome Date GA Lbr Len/2nd Weight Sex Delivery Anes PTL Lv  4 Para           3 Para           2 Para           1 Para                ROS: A ROS was performed and pertinent positives and negatives are included in the history.  GENERAL: No fevers or chills. HEENT: No change in vision, no earache, sore throat or sinus congestion. NECK: No pain or stiffness. CARDIOVASCULAR: No chest pain or pressure. No palpitations. PULMONARY: No shortness of breath, cough or wheeze. GASTROINTESTINAL: No abdominal pain, nausea, vomiting or diarrhea, melena or bright red blood per rectum. GENITOURINARY: No urinary frequency, urgency, hesitancy or dysuria.  MUSCULOSKELETAL: No joint or muscle pain, no back pain, no recent trauma. DERMATOLOGIC: No rash, no itching, no lesions. ENDOCRINE: No polyuria, polydipsia, no heat or cold intolerance. No recent change in weight. HEMATOLOGICAL: No anemia or easy bruising or bleeding. NEUROLOGIC: No headache, seizures, numbness, tingling or weakness. PSYCHIATRIC: No depression, no loss of interest in normal activity or change in sleep pattern.     Exam:   BP 124/84   Ht 5\' 4"  (1.626 m)   Wt 274 lb (124.3 kg)   BMI 47.03 kg/m   Body mass index is 47.03 kg/m.  General appearance : Well developed well nourished female. No acute distress HEENT: Eyes: no retinal hemorrhage or exudates,  Neck supple, trachea midline, no carotid bruits, no thyroidmegaly Lungs: Clear to auscultation, no rhonchi or wheezes, or rib retractions  Heart: Regular rate and rhythm, no murmurs or gallops Breast:Examined in sitting and supine position were symmetrical in appearance, no palpable masses or tenderness,  no skin retraction, no nipple inversion, no nipple discharge, no skin discoloration, no axillary or supraclavicular lymphadenopathy Abdomen: no palpable masses or tenderness, no rebound or guarding Extremities: no edema or skin discoloration or tenderness  Pelvic: Vulva normal  Bartholin, Urethra, Skene Glands: Within normal limits             Vagina:  No gross lesions or discharge  Cervix/Uterus absent  Adnexae:  Left pelvic mass felt about 5-6 cm.  Nontender.  Anus and perineum  normal    Assessment/Plan:  71 y.o. female for annual exam   1. Encounter for gynecological examination with abnormal finding Gyn exam s/p Hysterectomy.  Normal Pap test in 2017.  Will repeat Pap test at 3-5 years.  Breast exam normal.  Schedule screening mammogram as soon as possible.  Colonoscopy 2014.  Health labs with family physician.  2. Menopause present Well on no HRT.  3. Pelvic mass in female After telling the patient that a  pelvic mass was felt towards the left side on exam, she mentioned that a pelvic mass was seen on the MRI she had with the orthopedist.  She had forgotten about that and had not investigate further.  She will F/U for a pelvic US as soon as possible.  Rule out recurrence of her endometrial cancer or other pelvic pathology.  Will obtain MRI done with Ortho. - US Transvaginal Non-OB; Future  4. Endometrial cancer ([place]) History of a Stage 1A, grade 1 Endometrioid AdenoCa cancer diagnosed in 2014.  Had a robotic total laparoscopic hysterectomy with bilateral salpingo-oophorectomy and bilateral pelvic lymph node dissection with Dr. Nancy Marus on January 20, 2013.  She had no adjuvant therapy.  The pathology revealed invasive grade 1 endometrial adenocarcinoma, endometrioid type which was closely approaching the middle half of the endometrial section.  Lymph or vascular invasion was not identified and the lymph nodes were negative.   5. Osteopenia of multiple sites Patient had unexplained pelvic bone fractures on 2 different occasions in the past year.  Per patient there was no evidence of malignancy as the cause of the fracture on MRI.  She is planning to start on Prolia.  Recommend vitamin D supplements, calcium rich nutrition and was given the authorization to restart on low impact physical activity by her orthopedist.  Counseling on above issues more than 50% for 10 minutes.  [name] MD, 12:58 PM 03/14/2017

## 2017-03-17 ENCOUNTER — Encounter: Payer: Self-pay | Admitting: Obstetrics & Gynecology

## 2017-03-17 NOTE — Patient Instructions (Signed)
1. Encounter for gynecological examination with abnormal finding Gyn exam s/p Hysterectomy.  Normal Pap test in 2017.  Will repeat Pap test at 3-5 years.  Breast exam normal.  Schedule screening mammogram as soon as possible.  Colonoscopy 2014.  Health labs with family physician.  2. Menopause present Well on no HRT.  3. Pelvic mass in female After telling the patient that a pelvic mass was felt towards the left side on exam, she mentioned that a pelvic mass was seen on the MRI she had with the orthopedist.  She had forgotten about that and had not investigate further.  She will F/U for a pelvic US as soon as possible.  Rule out recurrence of her endometrial cancer or other pelvic pathology.  Will obtain MRI done with Ortho. - US Transvaginal Non-OB; Future  4. Endometrial cancer ([place]) History of a Stage 1A, grade 1 Endometrioid AdenoCa cancer diagnosed in 2014.  Had a robotic total laparoscopic hysterectomy with bilateral salpingo-oophorectomy and bilateral pelvic lymph node dissection with Dr. Nancy Marus on January 20, 2013.  She had no adjuvant therapy.  The pathology revealed invasive grade 1 endometrial adenocarcinoma, endometrioid type which was closely approaching the middle half of the endometrial section.  Lymph or vascular invasion was not identified and the lymph nodes were negative.   5. Osteopenia of multiple sites Patient had unexplained pelvic bone fractures on 2 different occasions in the past year.  Per patient there was no evidence of malignancy as the cause of the fracture on MRI.  She is planning to start on Prolia.  Recommend vitamin D supplements, calcium rich nutrition and was given the authorization to restart on low impact physical activity by her orthopedist.  [place] it was a pleasure seeing you today!  I will see you again soon for the pelvic ultrasound.

## 2017-03-18 LAB — PAP IG W/ RFLX HPV ASCU

## 2017-03-21 ENCOUNTER — Other Ambulatory Visit: Payer: Medicare Other

## 2017-03-21 ENCOUNTER — Ambulatory Visit: Payer: Medicare Other | Admitting: Obstetrics & Gynecology

## 2017-03-22 ENCOUNTER — Other Ambulatory Visit: Payer: Self-pay | Admitting: Anesthesiology

## 2017-03-22 DIAGNOSIS — R1900 Intra-abdominal and pelvic swelling, mass and lump, unspecified site: Secondary | ICD-10-CM

## 2017-03-25 DIAGNOSIS — S32591D Other specified fracture of right pubis, subsequent encounter for fracture with routine healing: Secondary | ICD-10-CM | POA: Diagnosis not present

## 2017-04-01 DIAGNOSIS — M25551 Pain in right hip: Secondary | ICD-10-CM | POA: Diagnosis not present

## 2017-04-08 ENCOUNTER — Ambulatory Visit (INDEPENDENT_AMBULATORY_CARE_PROVIDER_SITE_OTHER): Payer: Medicare Other | Admitting: Obstetrics & Gynecology

## 2017-04-08 ENCOUNTER — Ambulatory Visit: Payer: Medicare Other | Admitting: Obstetrics & Gynecology

## 2017-04-08 ENCOUNTER — Other Ambulatory Visit: Payer: Medicare Other

## 2017-04-08 ENCOUNTER — Encounter: Payer: Self-pay | Admitting: Obstetrics & Gynecology

## 2017-04-08 ENCOUNTER — Ambulatory Visit (INDEPENDENT_AMBULATORY_CARE_PROVIDER_SITE_OTHER): Payer: Medicare Other

## 2017-04-08 ENCOUNTER — Other Ambulatory Visit: Payer: Self-pay | Admitting: Obstetrics & Gynecology

## 2017-04-08 ENCOUNTER — Telehealth: Payer: Self-pay | Admitting: *Deleted

## 2017-04-08 DIAGNOSIS — Z8542 Personal history of malignant neoplasm of other parts of uterus: Secondary | ICD-10-CM

## 2017-04-08 DIAGNOSIS — C541 Malignant neoplasm of endometrium: Secondary | ICD-10-CM | POA: Diagnosis not present

## 2017-04-08 DIAGNOSIS — N949 Unspecified condition associated with female genital organs and menstrual cycle: Secondary | ICD-10-CM

## 2017-04-08 DIAGNOSIS — R1900 Intra-abdominal and pelvic swelling, mass and lump, unspecified site: Secondary | ICD-10-CM | POA: Diagnosis not present

## 2017-04-08 DIAGNOSIS — N9489 Other specified conditions associated with female genital organs and menstrual cycle: Secondary | ICD-10-CM

## 2017-04-08 DIAGNOSIS — Z8781 Personal history of (healed) traumatic fracture: Secondary | ICD-10-CM | POA: Diagnosis not present

## 2017-04-08 NOTE — Patient Instructions (Signed)
1. Left Adnexal cyst Large left pelvic simple cyst measuring 9 cm.  As symptomatic.  Probable pseudocyst, but in the context of history of endometrial cancer and multiple unexplained pelvic fractures, will refer to Dr. Alycia Rossetti for evaluation and management.  2. History of pelvic fracture Unexplained multiple pelvic fractures in the last few months.  No trauma, no fall.  No evidence of malignant infiltration on MRI.  Very slow healing.  Patient planning to start on Prolia with family physician.  3. Endometrial cancer ([place]) Stage I a grade 1 endometrioid adenocarcinoma of the endometrium in 2014.  Followed by Dr. Alycia Rossetti.  [place], good seeing you today!

## 2017-04-08 NOTE — Progress Notes (Signed)
    Shannon Gardner 1946/03/05 [id number]        71 y.o.  G4P4   RP:  Left Pelvic Cyst on MRI   HPI: No pelvic pain.  Status post robotic TLH BSO staging in 2014 with a diagnosis of stage I a grade 1 endometrioid adenocarcinoma of the endometrium.  Patient is followed by orthopedist for unexplained multiple pelvic fractures which are healing very slowly.  Ambulating well currently. No history of trauma or fall.  Prior MRI in October 2018, no evidence of malignant infiltration at the site of fractures.  On that MRI in October 2018, incidental finding of a simple left pelvic cyst measuring 8 cm.  Past medical history,surgical history, problem list, medications, allergies, family history and social history were all reviewed and documented in the EPIC chart.  Directed ROS with pertinent positives and negatives documented in the history of present illness/assessment and plan.  Exam:  There were no vitals filed for this visit. General appearance:  Normal  Pelvic US today: Status post hysterectomy and bilateral salpingo-oophorectomy.  Right adnexa negative.  Left adnexal thin-walled echo-free avascular cystic mass measuring 9.0 x 8.1 x 6.3 cm.  Negative color flow Doppler.  No evidence of left ovarian remnant.  Arterial blood flow seen at the level of the left iliac.  No free fluid in the pelvis.  MRI 01/21/2017: Large simple appearing cyst in the anterior aspect of the left hemipelvis measuring 8.0 x 6.5 x 5.5 cm.  No pathological enlargement of lymph nodes are identified.  Insufficiency fractures identified in the pelvis.  No cortical destruction to suggest an underlying infiltrative marrow lesion.   Assessment/Plan:  72 y.o. G4P4   1. Left Adnexal cyst Large left pelvic simple cyst measuring 9 cm.  As symptomatic.  Probable pseudocyst, but in the context of history of endometrial cancer and multiple unexplained pelvic fractures, will refer to Dr. Alycia Rossetti for evaluation and  management.  2. History of pelvic fracture Unexplained multiple pelvic fractures in the last few months.  No trauma, no fall.  No evidence of malignant infiltration on MRI.  Very slow healing.  Patient planning to start on Prolia with family physician.  3. Endometrial cancer ([place]) Stage I a grade 1 endometrioid adenocarcinoma of the endometrium in 2014.  Followed by Dr. Alycia Rossetti.  Counseling on above issues more than 50% for 15 minutes.  [name] MD, 9:22 AM 04/08/2017

## 2017-04-08 NOTE — Telephone Encounter (Signed)
Called and scheduled the patient for a follow up visit to see Dr. Alycia Rossetti on January 16th

## 2017-04-09 ENCOUNTER — Telehealth: Payer: Self-pay | Admitting: *Deleted

## 2017-04-09 NOTE — Telephone Encounter (Signed)
Spoke with Sharyn Lull at [facility] oncology and she called pt yesterday and is scheduled 04/10/17 with Dr.Gehrig.

## 2017-04-09 NOTE — Telephone Encounter (Signed)
-----   Message from [name], MD sent at 04/08/2017  9:32 AM EST ----- Regarding: Refer to Nancy Marus H/O Stage 1A grade 1  Endometrioid AdenoCa of Endometrium in 2014.  Has a 9.0 x 8.1 x 6.3 cm simple cyst in Left pelvis by Pelvic US 04/08/2017.  Mild increase in size x 01/21/2017 by MRI at 8.0 x 6.5 x 5.5 cm.  Multiple unexplained (no fall) pelvic fractures healing very slowly.  No evidence of bone infiltration on MRI.  Please reassess patient and manage.  Fam MD planning to start on Prolia.

## 2017-04-09 NOTE — Progress Notes (Signed)
Consult Note: Gyn-Onc   CC:  Chief Complaint  Patient presents with  . Endometrial ca [facility])    Assessment/Plan:  Ms. HAVANNA Gardner  is a 72 y.o.  year old with a 1A grade 1 endometrioid adenocarcinoma with no lymphovascular space involvement s/p definitive surgery 10/14. She did not require any adjuvant therapy. She will return to see [place] in 6 months and will see Dr. Dellis Filbert in 12 months.  I am not sure why she would have developed this large simple appearing cyst.  We will refer her to vascular radiology to see if it can be drained.  We will asked that they send the fluid for cytology.  I would not necessarily have the mass sclerosed at this time and would only consider doing that should it reaccumulate.  This was all explained to [name].  She knows that we will call her with the date and time for the procedure as well as follow-up on her cytology.  We will contact Dr. Delilah Shan to see if he thinks that his bone scan would be of benefit to assess the fractures in a different way and to rule out any evidence of malignancy.  She knows that we will contact her with that plan once we hear back from him.  In the meantime she will continue weaning herself off of her Prilosec.    HPI: [name]  is a 72 y.o. gravida 4 para 4 last normal menstrual period in her early 4s. The patient presented with reports of hematuria specifically bloody mucous discharge within her urine over the last 9 months. A workup was undertaken and that included an MRI which demonstrated a thickened irregular endometrium in the fundus measuring up to 2 cm diverticulosis of the colon was noted the adnexa appeared normal. The rectal remnant was noted that there were no findings of Eure, bladder cancer. Abdominal MRI was unremarkable and a CT scan did not add anything additional. She underwent cystoscopy and that was within normal limits. Underwent an endometrial biopsy on 12/18/2012 which demonstrated a  FIGO grade 1 endometrial adenocarcinoma.  Pap negative HPV negative. A colonoscopy on August of 2014 was notable for the presence of a benign polyp.  She underwent a total robotic hysterectomy bilateral pelvic lymph node dissection and bilateral salpingo-oophorectomy 01/20/2013. Operative findings included physiologic adhesions of the left colon to the left pelvic sidewall, no gross lymphadenopathy. The uterus and adnexa were normal bilaterally. There is significant drop to, no adipose tissue. Frozen section revealed a grade 1 tumor with no more than 50% myometrial invasion. Final pathology revealed:  1. Uterus +/- tubes/ovaries, neoplastic - INVASIVE GRADE I ENDOMETRIAL ADENOCARCINOMA, ENDOMETRIOID TYPE. - INVASIVE TUMOR CLOSELY APPROACHES MIDDLE HALF OF ENDOMYOMETRIAL SECTION ALTHOUGH IS ULTIMATELY CONFINED TO INNER HALF. - DEFINITIVE LYMPH/VASCULAR INVASION IS NOT IDENTIFIED. - SEE ONCOLOGY TEMPLATE. ADDITIONAL FINDINGS: - BENIGN CERVIX. - BENIGN BILATERAL OVARIES. - BENIGN BILATERAL FALLOPIAN TUBES. 2. Lymph nodes, regional resection, right pelvic - FIVE BENIGN LYMPH NODES WITH NO TUMOR SEEN (0/5). 3. Lymph nodes, regional resection, left pelvic - THREE BENIGN LYMPH NODES WITH NO TUMOR SEEN (0/3).  Interval History:  I last saw her in April 2018. She was seen by Dr. Dellis Filbert in December and then earlier this month. She had a negative pap smear in 12/18. Pelvic US: Status post hysterectomy and bilateral salpingo-oophorectomy.  Right adnexa negative.  Left adnexal thin-walled echo-free avascular cystic mass measuring 9.0 x 8.1 x 6.3 cm.  Negative color flow Doppler.  No evidence of left ovarian remnant.  Arterial blood flow seen at the level of the left iliac.  No free fluid in the pelvis.  MRI 01/21/2017: Large simple appearing cyst in the anterior aspect of the left hemipelvis measuring 8.0 x 6.5 x 5.5 cm.  No pathological enlargement of lymph nodes are identified.  Insufficiency fractures  identified in the pelvis.  No cortical destruction to suggest an underlying infiltrative marrow lesion.  She brought her disc with her as well as the reports from [place].  History really dates back to May where she had significant pelvic pain and had a MRI of her right hip that showed several fractures did show diverticulosis but it is unclear whether or not the MRI got all the way across her lower abdomen.  She had a bone density study in September that showed osteopenia but no osteoporosis.  It was felt that the fractures could be due to her Prilosec.  Again in October she felt something unusual on the left side she had the MRI of the pelvis with contrast this time that showed 2 additional broken bones as well as the cyst.  She is otherwise been asymptomatic.  She states that her pain is much better she is not been cognizant of this left-sided fluid collection in any way.  She denies any other symptoms.  She is slowly backing down on her Prilosec.   Current Meds:  Outpatient Encounter Medications as of 04/10/2017  Medication Sig  . amLODipine (NORVASC) 2.5 MG tablet Take 2.5 mg by mouth daily.  Marland Kitchen aspirin 81 MG tablet Take 81 mg by mouth every evening.   . celecoxib (CELEBREX) 200 MG capsule Take 200 mg by mouth daily as needed for mild pain.  . Cholecalciferol (D 10000) 10000 units CAPS Take by mouth.  Marland Kitchen HYDROcodone-acetaminophen (NORCO/VICODIN) 5-325 MG tablet TAKE 1 TABLET BY MOUTH EVERY 6 TO 8 HOURS AS NEEDED FOR PAIN  . ibuprofen (ADVIL,MOTRIN) 200 MG tablet Take 100-200 mg by mouth every 6 (six) hours as needed for moderate pain.   Marland Kitchen levothyroxine (SYNTHROID, LEVOTHROID) 112 MCG tablet Take 112 mcg by mouth daily before breakfast. No Substitutions " brand name Synthroid only"  . losartan-hydrochlorothiazide (HYZAAR) 100-25 MG per tablet Take 0.5 tablets by mouth daily.   Marland Kitchen omeprazole (PRILOSEC) 40 MG capsule 40 mg every morning.   . Pitavastatin Calcium (LIVALO) 2 MG TABS  Take 1 tablet by mouth daily.  . Probiotic Product (ALIGN) 4 MG CAPS Take 1 capsule by mouth at bedtime.   . ranitidine (ZANTAC) 150 MG tablet Take 150 mg by mouth at bedtime. Takes 2 tablets at bedtime.   No facility-administered encounter medications on file as of 04/10/2017.     Allergy:  Allergies  Allergen Reactions  . Altace [Ramipril] Hives  . Tussicaps [Hydrocod Polst-Cpm Polst Er] Rash    Social Hx:   Social History   Socioeconomic History  . Marital status: Married    Spouse name: Not on file  . Number of children: Not on file  . Years of education: Not on file  . Highest education level: Not on file  Social Needs  . Financial resource strain: Not on file  . Food insecurity - worry: Not on file  . Food insecurity - inability: Not on file  . Transportation needs - medical: Not on file  . Transportation needs - non-medical: Not on file  Occupational History  . Not on file  Tobacco Use  . Smoking status: Former  Smoker    Years: 12.00    Start date: 03/26/1974    Last attempt to quit: 03/26/1988    Years since quitting: 29.0  . Smokeless tobacco: Never Used  Substance and Sexual Activity  . Alcohol use: Yes    Alcohol/week: 0.0 oz    Comment: rare occ " 3-4 times a year"   . Drug use: No  . Sexual activity: Not Currently    Partners: Male    Comment: 1st intercourse- 16, partners- 42, married- 34 yrs   Other Topics Concern  . Not on file  Social History Narrative  . Not on file    Past Surgical Hx:  Past Surgical History:  Procedure Laterality Date  . LAPAROSCOPIC CHOLECYSTECTOMY  2000?  Marland Kitchen Left knee surg  2009   "Torn meniscus"   . ORIF ULNAR FRACTURE Right 12/28/2013   Procedure: OPEN REDUCTION INTERNAL FIXATION (ORIF) ULNAR AND RADIAL FRACTURE;  Surgeon: Charlotte Crumb, MD;  Location: [facility];  Service: Orthopedics;  Laterality: Right;  . ROBOTIC ASSISTED TOTAL HYSTERECTOMY WITH BILATERAL SALPINGO OOPHERECTOMY Bilateral 01/20/2013   Procedure: ROBOTIC  ASSISTED TOTAL ABDOMINAL HYSTERECTOMY WITH BILATERAL SALPINGO OOPHORECTOMY PELVIC LYMPH NODE DISSECTION;  Surgeon: Imagene Gurney A. Alycia Rossetti, MD;  Location: [facility];  Service: Gynecology;  Laterality: Bilateral;  . TONSILECTOMY, ADENOIDECTOMY, BILATERAL MYRINGOTOMY AND TUBES  1955    Past Medical Hx:  Past Medical History:  Diagnosis Date  . Cataract    not surgical yet  . GERD (gastroesophageal reflux disease)   . Headache(784.0)    migraines "cluster headaches"-most days  . Hypertension   . Hypothyroidism   . PONV (postoperative nausea and vomiting)   . Thyroid disease    diverticulosis  Past Gynecological History: Gravida 4 para 4 last normal menstrual period in her early 86s menarche at age 1 with regular menses reports oral contraceptive pill use intermittently over 10 years   No LMP recorded. Patient has had a hysterectomy. Denies any history of abnormal Pap test NSVD x4  Family Hx:  Family History  Problem Relation Age of Onset  . Cancer Mother        leomasarcoma   . Cancer Sister        lung - smoker  . Cancer Brother        prostate-   . Heart attack Father    her mother was diagnosed leiomyosarcoma presumed of lung origin at age of 45. Sister diagnosed with lung cancer the age of 46 brother diagnosed with prostate cancer in his 36s    Vitals:  Blood pressure 135/74, pulse 68, temperature 97.7 F (36.5 C), temperature source Oral, resp. rate 20, height 5' 4.5" (1.638 m), weight 274 lb (124.3 kg), SpO2 100 %. Body mass index is 46.31 kg/m.  Physical Exam: WD in NAD  Physical exam deferred as she recently had a pelvic exam, Pap smear, and pelvic ultrasound.   Aeneas Longsworth A., MD 04/10/2017, 11:36 AM

## 2017-04-10 ENCOUNTER — Encounter: Payer: Self-pay | Admitting: Gynecologic Oncology

## 2017-04-10 ENCOUNTER — Inpatient Hospital Stay: Payer: Medicare Other | Attending: Gynecologic Oncology | Admitting: Gynecologic Oncology

## 2017-04-10 VITALS — BP 135/74 | HR 68 | Temp 97.7°F | Resp 20 | Ht 64.5 in | Wt 274.0 lb

## 2017-04-10 DIAGNOSIS — C541 Malignant neoplasm of endometrium: Secondary | ICD-10-CM | POA: Insufficient documentation

## 2017-04-10 DIAGNOSIS — R1900 Intra-abdominal and pelvic swelling, mass and lump, unspecified site: Secondary | ICD-10-CM

## 2017-04-10 DIAGNOSIS — Z79899 Other long term (current) drug therapy: Secondary | ICD-10-CM

## 2017-04-10 DIAGNOSIS — I10 Essential (primary) hypertension: Secondary | ICD-10-CM | POA: Insufficient documentation

## 2017-04-10 DIAGNOSIS — Z7982 Long term (current) use of aspirin: Secondary | ICD-10-CM | POA: Diagnosis not present

## 2017-04-10 DIAGNOSIS — Z90710 Acquired absence of both cervix and uterus: Secondary | ICD-10-CM

## 2017-04-10 DIAGNOSIS — K219 Gastro-esophageal reflux disease without esophagitis: Secondary | ICD-10-CM | POA: Diagnosis not present

## 2017-04-10 DIAGNOSIS — E039 Hypothyroidism, unspecified: Secondary | ICD-10-CM | POA: Diagnosis not present

## 2017-04-10 DIAGNOSIS — Z90722 Acquired absence of ovaries, bilateral: Secondary | ICD-10-CM | POA: Insufficient documentation

## 2017-04-10 DIAGNOSIS — Z87891 Personal history of nicotine dependence: Secondary | ICD-10-CM

## 2017-04-10 NOTE — Patient Instructions (Addendum)
You will receive a phone call from the hospital to arrange for the drainage of the pelvic mass.  They will contact you directly.  We will touch base with Dr. Delilah Shan to see what his thoughts are regarding the need for a bone scan.  Please return to see [place] in 6 months.

## 2017-04-11 ENCOUNTER — Other Ambulatory Visit (HOSPITAL_COMMUNITY): Payer: Self-pay | Admitting: Interventional Radiology

## 2017-04-11 ENCOUNTER — Ambulatory Visit
Admission: RE | Admit: 2017-04-11 | Discharge: 2017-04-11 | Disposition: A | Discharge status: Home / Self Care | Payer: Self-pay | Source: Ambulatory Visit | Attending: Interventional Radiology | Admitting: Interventional Radiology

## 2017-04-11 DIAGNOSIS — C801 Malignant (primary) neoplasm, unspecified: Secondary | ICD-10-CM

## 2017-04-12 ENCOUNTER — Ambulatory Visit
Admission: RE | Admit: 2017-04-12 | Discharge: 2017-04-12 | Disposition: A | Discharge status: Home / Self Care | Payer: Self-pay | Source: Ambulatory Visit | Attending: Gynecologic Oncology | Admitting: Gynecologic Oncology

## 2017-04-12 ENCOUNTER — Other Ambulatory Visit: Payer: Self-pay | Admitting: Gynecologic Oncology

## 2017-04-12 DIAGNOSIS — C541 Malignant neoplasm of endometrium: Secondary | ICD-10-CM

## 2017-04-15 ENCOUNTER — Other Ambulatory Visit: Payer: Medicare Other

## 2017-04-15 ENCOUNTER — Ambulatory Visit: Payer: Medicare Other | Admitting: Obstetrics & Gynecology

## 2017-04-16 ENCOUNTER — Ambulatory Visit
Admission: RE | Admit: 2017-04-16 | Discharge: 2017-04-16 | Disposition: A | Discharge status: Home / Self Care | Payer: Self-pay | Source: Ambulatory Visit | Attending: Gynecologic Oncology | Admitting: Gynecologic Oncology

## 2017-04-16 ENCOUNTER — Other Ambulatory Visit: Payer: Self-pay | Admitting: Gynecologic Oncology

## 2017-04-16 ENCOUNTER — Encounter: Payer: Self-pay | Admitting: Gynecologic Oncology

## 2017-04-16 DIAGNOSIS — C541 Malignant neoplasm of endometrium: Secondary | ICD-10-CM

## 2017-04-16 NOTE — Progress Notes (Signed)
Note with message about whether bone scan would be warranted for patient faxed to Dr. Vickki Hearing direct at [facility] to [phone].

## 2017-04-17 ENCOUNTER — Other Ambulatory Visit: Payer: Self-pay | Admitting: Gynecologic Oncology

## 2017-04-17 DIAGNOSIS — R1900 Intra-abdominal and pelvic swelling, mass and lump, unspecified site: Secondary | ICD-10-CM

## 2017-04-18 ENCOUNTER — Other Ambulatory Visit (HOSPITAL_COMMUNITY): Payer: Self-pay | Admitting: Sports Medicine

## 2017-04-18 DIAGNOSIS — R102 Pelvic and perineal pain: Secondary | ICD-10-CM

## 2017-04-19 ENCOUNTER — Other Ambulatory Visit: Payer: Self-pay | Admitting: Radiology

## 2017-04-22 ENCOUNTER — Other Ambulatory Visit: Payer: Self-pay | Admitting: General Surgery

## 2017-04-23 ENCOUNTER — Ambulatory Visit (HOSPITAL_COMMUNITY)
Admission: RE | Admit: 2017-04-23 | Discharge: 2017-04-23 | Disposition: A | Discharge status: Home / Self Care | Payer: Medicare Other | Source: Ambulatory Visit | Attending: Gynecologic Oncology | Admitting: Gynecologic Oncology

## 2017-04-23 ENCOUNTER — Encounter (HOSPITAL_COMMUNITY): Payer: Self-pay

## 2017-04-23 DIAGNOSIS — T8143XA Infection following a procedure, organ and space surgical site, initial encounter: Secondary | ICD-10-CM | POA: Diagnosis not present

## 2017-04-23 DIAGNOSIS — Z79899 Other long term (current) drug therapy: Secondary | ICD-10-CM | POA: Diagnosis not present

## 2017-04-23 DIAGNOSIS — Z79890 Hormone replacement therapy (postmenopausal): Secondary | ICD-10-CM | POA: Insufficient documentation

## 2017-04-23 DIAGNOSIS — Z7982 Long term (current) use of aspirin: Secondary | ICD-10-CM | POA: Insufficient documentation

## 2017-04-23 DIAGNOSIS — Z809 Family history of malignant neoplasm, unspecified: Secondary | ICD-10-CM | POA: Insufficient documentation

## 2017-04-23 DIAGNOSIS — Z90710 Acquired absence of both cervix and uterus: Secondary | ICD-10-CM | POA: Insufficient documentation

## 2017-04-23 DIAGNOSIS — Z87891 Personal history of nicotine dependence: Secondary | ICD-10-CM | POA: Diagnosis not present

## 2017-04-23 DIAGNOSIS — K219 Gastro-esophageal reflux disease without esophagitis: Secondary | ICD-10-CM | POA: Diagnosis not present

## 2017-04-23 DIAGNOSIS — E039 Hypothyroidism, unspecified: Secondary | ICD-10-CM | POA: Insufficient documentation

## 2017-04-23 DIAGNOSIS — R1900 Intra-abdominal and pelvic swelling, mass and lump, unspecified site: Secondary | ICD-10-CM | POA: Diagnosis not present

## 2017-04-23 DIAGNOSIS — C541 Malignant neoplasm of endometrium: Secondary | ICD-10-CM | POA: Insufficient documentation

## 2017-04-23 DIAGNOSIS — I10 Essential (primary) hypertension: Secondary | ICD-10-CM | POA: Insufficient documentation

## 2017-04-23 DIAGNOSIS — N9489 Other specified conditions associated with female genital organs and menstrual cycle: Secondary | ICD-10-CM | POA: Diagnosis not present

## 2017-04-23 LAB — PROTIME-INR
INR: 1.01
Prothrombin Time: 13.2 seconds (ref 11.4–15.2)

## 2017-04-23 LAB — CBC
HCT: 42.7 % (ref 36.0–46.0)
Hemoglobin: 14.1 g/dL (ref 12.0–15.0)
MCH: 30.1 pg (ref 26.0–34.0)
MCHC: 33 g/dL (ref 30.0–36.0)
MCV: 91.2 fL (ref 78.0–100.0)
Platelets: 262 10*3/uL (ref 150–400)
RBC: 4.68 MIL/uL (ref 3.87–5.11)
RDW: 14.3 % (ref 11.5–15.5)
WBC: 7.8 10*3/uL (ref 4.0–10.5)

## 2017-04-23 LAB — APTT: aPTT: 31 seconds (ref 24–36)

## 2017-04-23 MED ORDER — FENTANYL CITRATE (PF) 100 MCG/2ML IJ SOLN
INTRAMUSCULAR | Status: DC | PRN
  Administered 2017-04-23 (×2): 50 ug via INTRAVENOUS

## 2017-04-23 MED ORDER — MIDAZOLAM HCL 2 MG/2ML IJ SOLN
INTRAMUSCULAR | Status: AC
  Filled 2017-04-23: qty 6

## 2017-04-23 MED ORDER — FENTANYL CITRATE (PF) 100 MCG/2ML IJ SOLN
INTRAMUSCULAR | Status: AC
  Filled 2017-04-23: qty 4

## 2017-04-23 MED ORDER — MIDAZOLAM HCL 2 MG/2ML IJ SOLN
INTRAMUSCULAR | Status: DC | PRN
  Administered 2017-04-23 (×2): 1 mg via INTRAVENOUS

## 2017-04-23 MED ORDER — SODIUM CHLORIDE 0.9 % IV SOLN: INTRAVENOUS | Status: DC

## 2017-04-23 MED ORDER — LIDOCAINE HCL 1 % IJ SOLN
INTRAMUSCULAR | Status: AC
  Filled 2017-04-23: qty 20

## 2017-04-23 NOTE — Sedation Documentation (Signed)
Patient is resting comfortably. 

## 2017-04-23 NOTE — Discharge Instructions (Signed)

## 2017-04-23 NOTE — Procedures (Signed)
Pre procedural Dx: Left sided pelvic cyst Post procedural Dx: Same  Technically successful CT guided aspiration of approximately 180 cc of serous fluid from L pelvic cyst.  All aspirated samples sent to the laboratory for analysis.    EBL: None  Complications: None immediate  Ronny Bacon, MD Pager #: [phone]

## 2017-04-23 NOTE — H&P (Signed)
Chief Complaint: Patient was seen in consultation today for left pelvic cyst aspiration at the request of Cross,Melissa D  Referring Physician(s): Cross,Melissa D Dr Terrilee Files  Supervising Physician: Sandi Mariscal  Patient Status: [facility] - Out-pt  History of Present Illness: Shannon Gardner is a 72 y.o. female   Pt with hx Endometrial Cancer-- hysterectomy 2004 Has had multiple breaks in pelvic bones in last several months MD working up -- without resolution yet In work up-- discovered Left pelvic cyst MRI 12/2016 performed at [facility] for aspiration with Hx Endometrial cancer Dr Pascal Lux has reviewed imaging and approves procedure   Past Medical History:  Diagnosis Date  . Cataract    not surgical yet  . GERD (gastroesophageal reflux disease)   . Headache(784.0)    migraines "cluster headaches"-most days  . Hypertension   . Hypothyroidism   . PONV (postoperative nausea and vomiting)   . Thyroid disease     Past Surgical History:  Procedure Laterality Date  . LAPAROSCOPIC CHOLECYSTECTOMY  2000?  Marland Kitchen Left knee surg  2009   "Torn meniscus"   . ORIF ULNAR FRACTURE Right 12/28/2013   Procedure: OPEN REDUCTION INTERNAL FIXATION (ORIF) ULNAR AND RADIAL FRACTURE;  Surgeon: Charlotte Crumb, MD;  Location: [facility];  Service: Orthopedics;  Laterality: Right;  . ROBOTIC ASSISTED TOTAL HYSTERECTOMY WITH BILATERAL SALPINGO OOPHERECTOMY Bilateral 01/20/2013   Procedure: ROBOTIC ASSISTED TOTAL ABDOMINAL HYSTERECTOMY WITH BILATERAL SALPINGO OOPHORECTOMY PELVIC LYMPH NODE DISSECTION;  Surgeon: Imagene Gurney A. Alycia Rossetti, MD;  Location: [facility];  Service: Gynecology;  Laterality: Bilateral;  . TONSILECTOMY, ADENOIDECTOMY, BILATERAL MYRINGOTOMY AND TUBES  1955    Allergies: Altace [ramipril] and Tussicaps [hydrocod polst-cpm polst er]  Medications: Prior to Admission medications   Medication Sig Start Date End Date Taking? Authorizing Provider  amLODipine (NORVASC) 2.5  MG tablet Take 2.5 mg by mouth daily.   Yes [provider]  aspirin 81 MG tablet Take 81 mg by mouth every evening.    Yes [provider]  celecoxib (CELEBREX) 200 MG capsule Take 200 mg by mouth daily as needed for mild pain.   Yes [provider]  Cholecalciferol (D 10000) 10000 units CAPS Take by mouth.   Yes [provider]  HYDROcodone-acetaminophen (NORCO/VICODIN) 5-325 MG tablet TAKE 1 TABLET BY MOUTH EVERY 6 TO 8 HOURS AS NEEDED FOR PAIN 03/25/17  Yes [provider]  ibuprofen (ADVIL,MOTRIN) 200 MG tablet Take 100-200 mg by mouth every 6 (six) hours as needed for moderate pain.    Yes [provider]  levothyroxine (SYNTHROID, LEVOTHROID) 112 MCG tablet Take 112 mcg by mouth daily before breakfast. No Substitutions " brand name Synthroid only"   Yes [provider]  losartan-hydrochlorothiazide (HYZAAR) 100-25 MG per tablet Take 0.5 tablets by mouth daily.  07/02/14  Yes [provider]  omeprazole (PRILOSEC) 40 MG capsule 40 mg every morning.  11/01/14  Yes [provider]  Pitavastatin Calcium (LIVALO) 2 MG TABS Take 1 tablet by mouth daily.   Yes [provider]  Probiotic Product (ALIGN) 4 MG CAPS Take 1 capsule by mouth at bedtime.    Yes [provider]  ranitidine (ZANTAC) 150 MG tablet Take 150 mg by mouth at bedtime. Takes 2 tablets at bedtime.   Yes [provider]     Family History  Problem Relation Age of Onset  . Cancer Mother        leomasarcoma   . Cancer Sister  lung - smoker  . Cancer Brother        prostate-   . Heart attack Father     Social History   Socioeconomic History  . Marital status: Married    Spouse name: None  . Number of children: None  . Years of education: None  . Highest education level: None  Social Needs  . Financial resource strain: None  . Food insecurity - worry: None  . Food insecurity - inability: None  . Transportation  needs - medical: None  . Transportation needs - non-medical: None  Occupational History  . None  Tobacco Use  . Smoking status: Former Smoker    Years: 12.00    Start date: 03/26/1974    Last attempt to quit: 03/26/1988    Years since quitting: 29.0  . Smokeless tobacco: Never Used  Substance and Sexual Activity  . Alcohol use: Yes    Alcohol/week: 0.0 oz    Comment: rare occ " 3-4 times a year"   . Drug use: No  . Sexual activity: Not Currently    Partners: Male    Comment: 1st intercourse- 23, partners- 75, married- 68 yrs   Other Topics Concern  . None  Social History Narrative  . None    Review of Systems: A 12 point ROS discussed and pertinent positives are indicated in the HPI above.  All other systems are negative.  Review of Systems  Constitutional: Positive for activity change and fatigue. Negative for fever.  Respiratory: Negative for cough and choking.   Cardiovascular: Negative for chest pain.  Gastrointestinal: Negative for abdominal pain.  Musculoskeletal: Positive for back pain and gait problem.  Neurological: Positive for weakness.  Psychiatric/Behavioral: Negative for behavioral problems and confusion.    Vital Signs: There were no vitals taken for this visit.  Physical Exam  Constitutional: She is oriented to person, place, and time.  Cardiovascular: Normal rate, regular rhythm and normal heart sounds.  Pulmonary/Chest: Effort normal and breath sounds normal.  Abdominal: Soft. Bowel sounds are normal.  Musculoskeletal: Normal range of motion.  Neurological: She is alert and oriented to person, place, and time.  Skin: Skin is warm and dry.  Psychiatric: She has a normal mood and affect. Her behavior is normal. Judgment and thought content normal.  Nursing note and vitals reviewed.   Imaging: No results found.  Labs:  CBC: Recent Labs    04/23/17 0944  WBC 7.8  HGB 14.1  HCT 42.7  PLT 262    COAGS: Recent Labs    04/23/17 0944  INR  1.01  APTT 31    BMP: No results for input(s): NA, K, CL, CO2, GLUCOSE, BUN, CALCIUM, CREATININE, GFRNONAA, GFRAA in the last 8760 hours.  Invalid input(s): CMP  LIVER FUNCTION TESTS: No results for input(s): BILITOT, AST, ALT, ALKPHOS, PROT, ALBUMIN in the last 8760 hours.  TUMOR MARKERS: No results for input(s): AFPTM, CEA, CA199, CHROMGRNA in the last 8760 hours.  Assessment and Plan:  Left pelvic cyst Hx endometrial cancer Scheduled for cyst aspiration Risks and benefits discussed with the patient including, but not limited to bleeding, infection, damage to adjacent structures or low yield requiring additional tests. All of the patient's questions were answered, patient is agreeable to proceed. Consent signed and in chart.  Thank you for this interesting consult.  I greatly enjoyed meeting Shannon Gardner and look forward to participating in their care.  A copy of this report was sent to the requesting provider on  this date.  Electronically Signed: Lavonia Drafts, PA-C 04/23/2017, 10:39 AM   I spent a total of  30 Minutes   in face to face in clinical consultation, greater than 50% of which was counseling/coordinating care for left pelvic cyst aspiration

## 2017-04-24 ENCOUNTER — Telehealth: Payer: Self-pay | Admitting: Gynecologic Oncology

## 2017-04-24 NOTE — Telephone Encounter (Signed)
Called to follow up about bone scan for the patient.  Spoke with Heidi who stated Dr. Delilah Shan ordered the bone scan and the patient has been notified.

## 2017-04-26 DIAGNOSIS — S3282XD Multiple fractures of pelvis without disruption of pelvic ring, subsequent encounter for fracture with routine healing: Secondary | ICD-10-CM | POA: Diagnosis not present

## 2017-04-26 DIAGNOSIS — R102 Pelvic and perineal pain: Secondary | ICD-10-CM | POA: Diagnosis not present

## 2017-04-30 DIAGNOSIS — M810 Age-related osteoporosis without current pathological fracture: Secondary | ICD-10-CM | POA: Diagnosis not present

## 2017-05-03 ENCOUNTER — Encounter (HOSPITAL_COMMUNITY)
Admission: RE | Admit: 2017-05-03 | Discharge: 2017-05-03 | Disposition: A | Discharge status: Home / Self Care | Payer: Medicare Other | Source: Ambulatory Visit | Attending: Sports Medicine | Admitting: Sports Medicine

## 2017-05-03 DIAGNOSIS — X58XXXA Exposure to other specified factors, initial encounter: Secondary | ICD-10-CM | POA: Insufficient documentation

## 2017-05-03 DIAGNOSIS — S32509A Unspecified fracture of unspecified pubis, initial encounter for closed fracture: Secondary | ICD-10-CM | POA: Diagnosis not present

## 2017-05-03 DIAGNOSIS — R102 Pelvic and perineal pain: Secondary | ICD-10-CM

## 2017-05-03 MED ORDER — TECHNETIUM TC 99M MEDRONATE IV KIT
25.0000 | PACK | Freq: Once | INTRAVENOUS | Status: AC | PRN
  Administered 2017-05-03: 21.1 via INTRAVENOUS

## 2017-06-10 DIAGNOSIS — Z9622 Myringotomy tube(s) status: Secondary | ICD-10-CM | POA: Diagnosis not present

## 2017-06-10 DIAGNOSIS — G960 Cerebrospinal fluid leak: Secondary | ICD-10-CM | POA: Diagnosis not present

## 2017-06-18 DIAGNOSIS — S3282XD Multiple fractures of pelvis without disruption of pelvic ring, subsequent encounter for fracture with routine healing: Secondary | ICD-10-CM | POA: Diagnosis not present

## 2017-06-18 DIAGNOSIS — R102 Pelvic and perineal pain: Secondary | ICD-10-CM | POA: Diagnosis not present

## 2017-06-18 DIAGNOSIS — M545 Low back pain: Secondary | ICD-10-CM | POA: Diagnosis not present

## 2017-06-25 DIAGNOSIS — M545 Low back pain: Secondary | ICD-10-CM | POA: Diagnosis not present

## 2017-07-01 DIAGNOSIS — R102 Pelvic and perineal pain: Secondary | ICD-10-CM | POA: Diagnosis not present

## 2017-07-01 DIAGNOSIS — S3282XD Multiple fractures of pelvis without disruption of pelvic ring, subsequent encounter for fracture with routine healing: Secondary | ICD-10-CM | POA: Diagnosis not present

## 2017-07-04 ENCOUNTER — Other Ambulatory Visit: Payer: Self-pay

## 2017-07-04 NOTE — Patient Outreach (Signed)
[facility]) Care Management  07/04/2017  TALINA PLEITEZ 1946-03-02 [id number]   Medication Adherence call to Mrs. Alizee Maple patient is showing past due under [place] health Care Ins.on Livalo 2 mg spoke with patient she said she is no longer taking this medication doctor took her off and now she is taking Zetia instead.  [place] Management Direct Dial [phone]  Fax [phone] [email or web address]

## 2017-09-05 DIAGNOSIS — E559 Vitamin D deficiency, unspecified: Secondary | ICD-10-CM | POA: Diagnosis not present

## 2017-09-05 DIAGNOSIS — I10 Essential (primary) hypertension: Secondary | ICD-10-CM | POA: Diagnosis not present

## 2017-09-05 DIAGNOSIS — E032 Hypothyroidism due to medicaments and other exogenous substances: Secondary | ICD-10-CM | POA: Diagnosis not present

## 2017-09-12 DIAGNOSIS — I10 Essential (primary) hypertension: Secondary | ICD-10-CM | POA: Diagnosis not present

## 2017-09-12 DIAGNOSIS — E559 Vitamin D deficiency, unspecified: Secondary | ICD-10-CM | POA: Diagnosis not present

## 2017-09-12 DIAGNOSIS — Z0000 Encounter for general adult medical examination without abnormal findings: Secondary | ICD-10-CM | POA: Diagnosis not present

## 2017-09-12 DIAGNOSIS — K219 Gastro-esophageal reflux disease without esophagitis: Secondary | ICD-10-CM | POA: Diagnosis not present

## 2017-09-12 DIAGNOSIS — E032 Hypothyroidism due to medicaments and other exogenous substances: Secondary | ICD-10-CM | POA: Diagnosis not present

## 2017-09-12 DIAGNOSIS — M810 Age-related osteoporosis without current pathological fracture: Secondary | ICD-10-CM | POA: Diagnosis not present

## 2017-10-30 DIAGNOSIS — M810 Age-related osteoporosis without current pathological fracture: Secondary | ICD-10-CM | POA: Diagnosis not present

## 2017-11-05 DIAGNOSIS — Z86010 Personal history of colonic polyps: Secondary | ICD-10-CM | POA: Diagnosis not present

## 2017-11-05 DIAGNOSIS — Z1211 Encounter for screening for malignant neoplasm of colon: Secondary | ICD-10-CM | POA: Diagnosis not present

## 2017-11-05 DIAGNOSIS — K5730 Diverticulosis of large intestine without perforation or abscess without bleeding: Secondary | ICD-10-CM | POA: Diagnosis not present

## 2017-11-11 DIAGNOSIS — H5213 Myopia, bilateral: Secondary | ICD-10-CM | POA: Diagnosis not present

## 2017-11-11 DIAGNOSIS — H25013 Cortical age-related cataract, bilateral: Secondary | ICD-10-CM | POA: Diagnosis not present

## 2017-11-18 DIAGNOSIS — M25562 Pain in left knee: Secondary | ICD-10-CM | POA: Diagnosis not present

## 2017-11-18 DIAGNOSIS — M25561 Pain in right knee: Secondary | ICD-10-CM | POA: Diagnosis not present

## 2017-11-18 DIAGNOSIS — S3282XD Multiple fractures of pelvis without disruption of pelvic ring, subsequent encounter for fracture with routine healing: Secondary | ICD-10-CM | POA: Diagnosis not present

## 2017-11-21 ENCOUNTER — Inpatient Hospital Stay: Payer: Medicare Other | Attending: Gynecologic Oncology | Admitting: Gynecologic Oncology

## 2017-11-21 ENCOUNTER — Encounter: Payer: Self-pay | Admitting: Gynecologic Oncology

## 2017-11-21 VITALS — BP 143/75 | HR 57 | Temp 97.8°F | Resp 18 | Ht 64.5 in | Wt 271.6 lb

## 2017-11-21 DIAGNOSIS — R188 Other ascites: Secondary | ICD-10-CM

## 2017-11-21 DIAGNOSIS — Z87891 Personal history of nicotine dependence: Secondary | ICD-10-CM

## 2017-11-21 DIAGNOSIS — C541 Malignant neoplasm of endometrium: Secondary | ICD-10-CM | POA: Insufficient documentation

## 2017-11-21 DIAGNOSIS — Z90710 Acquired absence of both cervix and uterus: Secondary | ICD-10-CM

## 2017-11-21 DIAGNOSIS — Z90722 Acquired absence of ovaries, bilateral: Secondary | ICD-10-CM | POA: Diagnosis not present

## 2017-11-21 NOTE — Progress Notes (Signed)
GYN ONCOLOGY OFFICE VISIT  Chief Complaint:  Endometrial cancer surveillance  Assessment/Plan:  [name]  is a 72 y.o.  year old with a 1A grade 1 endometrioid adenocarcinoma with no lymphovascular space involvement s/p definitive surgery 10/14. She did not require any adjuvant therapy.  No evidence of disease almost 5 years after surgical staging Follow-up with  Dr. Dellis Filbert GYN oncology as necessary  Peritoneal fluid collection Previously drained with re accumulation Will order a pelvic ultrasound to assess   HPI: [name]  is a 72 y.o. gravida 4 para 4 last normal menstrual period in her early 25s. The patient presented with reports of hematuria specifically bloody mucous discharge within her urine over the last 9 months. A workup was undertaken and that included an MRI which demonstrated a thickened irregular endometrium in the fundus measuring up to 2 cm diverticulosis of the colon was noted the adnexa appeared normal. The rectal remnant was noted that there were no findings of Eure, bladder cancer. Abdominal MRI was unremarkable and a CT scan did not add anything additional. She underwent cystoscopy and that was within normal limits. Underwent an endometrial biopsy on 12/18/2012 which demonstrated a FIGO grade 1 endometrial adenocarcinoma.  Pap negative HPV negative. A colonoscopy on August of 2014 was notable for the presence of a benign polyp.  She underwent a total robotic hysterectomy bilateral pelvic lymph node dissection and bilateral salpingo-oophorectomy 01/20/2013. Operative findings included physiologic adhesions of the left colon to the left pelvic sidewall, no gross lymphadenopathy. The uterus and adnexa were normal bilaterally. There is significant drop to, no adipose tissue. Frozen section revealed a grade 1 tumor with no more than 50% myometrial invasion. Final pathology revealed:  1. Uterus +/- tubes/ovaries, neoplastic - INVASIVE GRADE I  ENDOMETRIAL ADENOCARCINOMA, ENDOMETRIOID TYPE. - INVASIVE TUMOR CLOSELY APPROACHES MIDDLE HALF OF ENDOMYOMETRIAL SECTION ALTHOUGH IS ULTIMATELY CONFINED TO INNER HALF. - DEFINITIVE LYMPH/VASCULAR INVASION IS NOT IDENTIFIED. - SEE ONCOLOGY TEMPLATE. ADDITIONAL FINDINGS: - BENIGN CERVIX. - BENIGN BILATERAL OVARIES. - BENIGN BILATERAL FALLOPIAN TUBES. 2. Lymph nodes, regional resection, right pelvic - FIVE BENIGN LYMPH NODES WITH NO TUMOR SEEN (0/5). 3. Lymph nodes, regional resection, left pelvic - THREE BENIGN LYMPH NODES WITH NO TUMOR SEEN (0/3).  Interval History: She's overall doing quite well. The pelvic fluid collection was drained and cytology was negative.  Repeat imaging by ultrasound in February 2019 showed reaccumulation of this likely  peritoneal cyst.  Purvis Kilts denies discomfort, no bloating no changes in bowel or bladder habits   Review of Systems  Constitutional: Denies fever.  Skin: No rash, sores  Cardiovascular: No chest pain, shortness of breath, or edema  Pulmonary: No cough or wheeze.  Gastro Intestinal: No nausea, vomiting, constipation, or diarrhea reported. No change in bowel movement.  Genitourinary: No frequency, urgency, or dysuria.  Denies vaginal bleeding and discharge.  Musculoskeletal: Denies changes Neurologic: No weakness, numbness, or change in gait.  Psychology: No changes  Social Hx:   Social History   Socioeconomic History  . Marital status: Married    Spouse name: Not on file  . Number of children: Not on file  . Years of education: Not on file  . Highest education level: Not on file  Occupational History  . Not on file  Social Needs  . Financial resource strain: Not on file  . Food insecurity:    Worry: Not on file    Inability: Not on file  . Transportation needs:  Medical: Not on file    Non-medical: Not on file  Tobacco Use  . Smoking status: Former Smoker    Years: 12.00    Start date: 03/26/1974    Last attempt  to quit: 03/26/1988    Years since quitting: 29.6  . Smokeless tobacco: Never Used  Substance and Sexual Activity  . Alcohol use: Yes    Comment: rare occ " 3-4 times a year"   . Drug use: No  . Sexual activity: Not Currently    Partners: Male    Comment: 1st intercourse- 28, partners- 58, married- 74 yrs   Lifestyle  . Physical activity:    Days per week: Not on file    Minutes per session: Not on file  . Stress: Not on file  Relationships  . Social connections:    Talks on phone: Not on file    Gets together: Not on file    Attends religious service: Not on file    Active member of club or organization: Not on file    Attends meetings of clubs or organizations: Not on file    Relationship status: Not on file  . Intimate partner violence:    Fear of current or ex partner: Not on file    Emotionally abused: Not on file    Physically abused: Not on file    Forced sexual activity: Not on file  Other Topics Concern  . Not on file  Social History Narrative  . Not on file    Past Surgical Hx:  Past Surgical History:  Procedure Laterality Date  . LAPAROSCOPIC CHOLECYSTECTOMY  2000?  Marland Kitchen Left knee surg  2009   "Torn meniscus"   . ORIF ULNAR FRACTURE Right 12/28/2013   Procedure: OPEN REDUCTION INTERNAL FIXATION (ORIF) ULNAR AND RADIAL FRACTURE;  Surgeon: Charlotte Crumb, MD;  Location: [facility];  Service: Orthopedics;  Laterality: Right;  . ROBOTIC ASSISTED TOTAL HYSTERECTOMY WITH BILATERAL SALPINGO OOPHERECTOMY Bilateral 01/20/2013   Procedure: ROBOTIC ASSISTED TOTAL ABDOMINAL HYSTERECTOMY WITH BILATERAL SALPINGO OOPHORECTOMY PELVIC LYMPH NODE DISSECTION;  Surgeon: Imagene Gurney A. Alycia Rossetti, MD;  Location: [facility];  Service: Gynecology;  Laterality: Bilateral;  . TONSILECTOMY, ADENOIDECTOMY, BILATERAL MYRINGOTOMY AND TUBES  1955    Past Medical Hx:  Past Medical History:  Diagnosis Date  . Cataract    not surgical yet  . GERD (gastroesophageal reflux disease)   . Headache(784.0)     migraines "cluster headaches"-most days  . Hypertension   . Hypothyroidism   . PONV (postoperative nausea and vomiting)   . Thyroid disease    diverticulosis  Past Gynecological History: Gravida 4 para 4 last normal menstrual period in her early 29s menarche at age 61 with regular menses reports oral contraceptive pill use intermittently over 10 years.  Denies any history of abnormal Pap test NSVD x4  Family Hx:  Family History  Problem Relation Age of Onset  . Cancer Mother        leomasarcoma   . Cancer Sister        lung - smoker  . Cancer Brother        prostate-   . Heart attack Father    her mother was diagnosed leiomyosarcoma presumed of lung origin at age of 77. Sister diagnosed with lung cancer the age of 38 brother diagnosed with prostate cancer in his 58s  Vitals:  Blood pressure (!) 143/75, pulse (!) 57, temperature 97.8 F (36.6 C), temperature source Oral, resp. rate 18, height 5' 4.5" (1.638 m),  weight 271 lb 9.6 oz (123.2 kg), SpO2 100 %. Body mass index is 45.9 kg/m.  Physical Exam: WD in NAD  Alert and oriented to person, place, and time appropriate speech mood and affect  Neck: Supple, no lymphadenopathy, no thyromegaly.  Lungs: Distant breath sounds clear to auscultation.  Cardiovascular: Regular rate and rhythm with distant heart sounds.    Abdomen: Well-healed surgical incisions. Abdomen is soft and nontender. No palpable masses or hepatosplenomegaly but exam is limited by habitus. Large pannus. No skin changes under the pannus.  Extremities: 1+ nonpitting edema equal bilaterally. Well healed incision on right forearm.  Groins: No lymphadenopathy but exam is limited by habitus.  Pelvic: Normal external female genitalia. The vagina is atrophic. Bimanual examination reveals no masses or nodularity. Rectovaginal examination confirms.   Janie Morning, MD 11/21/2017, 9:32 AM

## 2017-11-21 NOTE — Patient Instructions (Signed)
Plan on having an ultrasound to follow up on the pelvic fluid collection drained previously. We will call you with the results of your ultrasound.  If normal, no follow up necessary with GYN ONC and follow up with your previous GYN and PCP.

## 2017-11-28 ENCOUNTER — Ambulatory Visit (HOSPITAL_COMMUNITY)
Admission: RE | Admit: 2017-11-28 | Discharge: 2017-11-28 | Disposition: A | Discharge status: Home / Self Care | Payer: Medicare Other | Source: Ambulatory Visit | Attending: Gynecologic Oncology | Admitting: Gynecologic Oncology

## 2017-11-28 DIAGNOSIS — R188 Other ascites: Secondary | ICD-10-CM

## 2017-11-28 DIAGNOSIS — N9489 Other specified conditions associated with female genital organs and menstrual cycle: Secondary | ICD-10-CM | POA: Diagnosis not present

## 2017-11-28 DIAGNOSIS — Z90710 Acquired absence of both cervix and uterus: Secondary | ICD-10-CM | POA: Diagnosis not present

## 2017-11-28 DIAGNOSIS — K6811 Postprocedural retroperitoneal abscess: Secondary | ICD-10-CM | POA: Diagnosis not present

## 2017-12-05 ENCOUNTER — Telehealth: Payer: Self-pay

## 2017-12-05 NOTE — Telephone Encounter (Signed)
LM for patient as she requested if not available to answer her phone with [place] results. Stated that the fluid collection was stable/ unchanged from previous US  Per Joylene John, NP.  She is 5 years out from diagnosis and does not need to been seen in [place] clinic with Dr. Skeet Latch.  She needs to have regular visits with PCP and GYN. She can call the office if she has any questions.

## 2017-12-25 DIAGNOSIS — K5730 Diverticulosis of large intestine without perforation or abscess without bleeding: Secondary | ICD-10-CM | POA: Diagnosis not present

## 2017-12-25 DIAGNOSIS — Z1211 Encounter for screening for malignant neoplasm of colon: Secondary | ICD-10-CM | POA: Diagnosis not present

## 2018-01-08 ENCOUNTER — Ambulatory Visit: Payer: Medicare Other | Admitting: Obstetrics & Gynecology

## 2018-01-08 ENCOUNTER — Encounter: Payer: Self-pay | Admitting: Obstetrics & Gynecology

## 2018-01-08 VITALS — BP 124/70

## 2018-01-08 DIAGNOSIS — L98491 Non-pressure chronic ulcer of skin of other sites limited to breakdown of skin: Secondary | ICD-10-CM

## 2018-01-08 MED ORDER — CLOBETASOL PROPIONATE 0.05 % EX OINT: 1.0000 "application " | TOPICAL_OINTMENT | Freq: Two times a day (BID) | CUTANEOUS | 0 refills | Status: AC

## 2018-01-08 NOTE — Progress Notes (Signed)
    Shannon Gardner 1946-02-25 [id number]        72 y.o.  G4P4L4  RP: Burning at perineum x 1 month  HPI: Complains of burning at the perineum for about 7 months.  Patient feels that the skin is open at that level.  No abnormal vaginal discharge.  No bleeding at that level.  Abstinent.  No change in physical activity.  Urine and bowel movements normal.  History of 4 vaginal deliveries with episiotomy.     OB History  Gravida Para Term Preterm AB Living  4 4       4   SAB TAB Ectopic Multiple Live Births               # Outcome Date GA Lbr Len/2nd Weight Sex Delivery Anes PTL Lv  4 Para           3 Para           2 Para           1 Para             Past medical history,surgical history, problem list, medications, allergies, family history and social history were all reviewed and documented in the EPIC chart.   Directed ROS with pertinent positives and negatives documented in the history of present illness/assessment and plan.  Exam:  Vitals:   01/08/18 1418  BP: 124/70   General appearance:  Normal  Gynecologic exam: Perineal vertical line of ulceration.  HSV Sure Swab done.   Assessment/Plan:  72 y.o. G4P4   1. Skin ulcer of perineum, limited to breakdown of skin (HCC) Postmenopausal atrophy at the vulva.  Linear ulceration/fissure particularly at the perineum, corresponds to the site of previous episiotomies.  HSV sure swab done.  We will try to decrease inflammation with clobetasol ointment applications twice a day for 2 weeks.  Instructed to avoid touching the area otherwise.  Warm baths for soaking recommended.  If no improvement in 2 weeks, will make an appointment to reevaluate and investigate with a biopsy. - SureSwab HSV, Type 1/2 DNA, PCR  Other orders - clobetasol ointment (TEMOVATE) 0.05 %; Apply 1 application topically 2 (two) times daily for 14 days. Thin layer on perineum  Counseling on above issues and coordination of care more than 50% for 15  minutes.  [name] MD, 2:45 PM 01/08/2018

## 2018-01-11 LAB — SURESWAB HSV, TYPE 1/2 DNA, PCR
HSV 1 DNA: NOT DETECTED
HSV 2 DNA: NOT DETECTED

## 2018-03-10 DIAGNOSIS — E559 Vitamin D deficiency, unspecified: Secondary | ICD-10-CM | POA: Diagnosis not present

## 2018-03-10 DIAGNOSIS — I10 Essential (primary) hypertension: Secondary | ICD-10-CM | POA: Diagnosis not present

## 2018-03-10 DIAGNOSIS — E032 Hypothyroidism due to medicaments and other exogenous substances: Secondary | ICD-10-CM | POA: Diagnosis not present

## 2018-03-17 DIAGNOSIS — I10 Essential (primary) hypertension: Secondary | ICD-10-CM | POA: Diagnosis not present

## 2018-03-17 DIAGNOSIS — K219 Gastro-esophageal reflux disease without esophagitis: Secondary | ICD-10-CM | POA: Diagnosis not present

## 2018-04-07 ENCOUNTER — Encounter: Payer: Self-pay | Admitting: Obstetrics & Gynecology

## 2018-04-07 ENCOUNTER — Encounter: Payer: Medicare Other | Admitting: Obstetrics & Gynecology

## 2018-04-07 ENCOUNTER — Ambulatory Visit: Payer: Medicare Other | Admitting: Obstetrics & Gynecology

## 2018-04-07 VITALS — BP 120/70 | Ht 63.0 in | Wt 275.0 lb

## 2018-04-07 DIAGNOSIS — Z8542 Personal history of malignant neoplasm of other parts of uterus: Secondary | ICD-10-CM

## 2018-04-07 DIAGNOSIS — Z780 Asymptomatic menopausal state: Secondary | ICD-10-CM

## 2018-04-07 DIAGNOSIS — Z01419 Encounter for gynecological examination (general) (routine) without abnormal findings: Secondary | ICD-10-CM

## 2018-04-07 DIAGNOSIS — E66813 Obesity, class 3: Secondary | ICD-10-CM

## 2018-04-07 DIAGNOSIS — C541 Malignant neoplasm of endometrium: Secondary | ICD-10-CM

## 2018-04-07 DIAGNOSIS — Z6841 Body Mass Index (BMI) 40.0 and over, adult: Secondary | ICD-10-CM

## 2018-04-07 DIAGNOSIS — M810 Age-related osteoporosis without current pathological fracture: Secondary | ICD-10-CM

## 2018-04-07 DIAGNOSIS — Z1272 Encounter for screening for malignant neoplasm of vagina: Secondary | ICD-10-CM

## 2018-04-07 NOTE — Patient Instructions (Signed)
1. Encounter for Papanicolaou smear of vagina as part of routine gynecological examination Gynecologic exam status post total hysterectomy with BSO.  Pap test done at the vaginal vault.  Breast exam normal.  Will schedule screening mammogram at [facility] now.  Colonoscopy September 2019.  Health labs with family physician.  2. Postmenopause Well on no hormone replacement therapy.    3. Endometrial cancer ([place]) Status post robotic TLH with BSO and pelvic node dissection in 2014.  Stage Ia grade 1 endometrioid adenocarcinoma of the endometrium.  No adjuvant therapy necessary.  4. Age-related osteoporosis without current pathological fracture Clinical osteoporosis given 2 fragility fractures of the pelvis.  Started on Prolia January 2019.  Will have an MRI soon to confirm complete healing of the pelvic fractures.  Vitamin D supplements, calcium intake of 1.5 g/day and resume weightbearing physical activities when confirmation of full healing of the pelvic fractures.  5. Class 3 severe obesity due to excess calories with serious comorbidity and body mass index (BMI) of 45.0 to 49.9 in adult [facility]) Recommend a low calorie/carb diet.  Will start stationary biking.  Recommend upper body light weightlifting every 2 days.  Other orders - telmisartan-hydrochlorothiazide (MICARDIS HCT) 80-12.5 MG tablet; Take 1 tablet by mouth daily.  [place], it was a pleasure seeing you today!  I will inform you of your results as soon as they are available.

## 2018-04-07 NOTE — Progress Notes (Signed)
Shannon Gardner 09-10-1945 [id number]   History:    73 y.o. G11P4L4 Married.  9 Grand-Chilfren.    RP:  Established patient presenting for annual gyn exam   HPI:  Menopause, well on no hormone replacement therapy.  Status post  robotic total laparoscopic hysterectomy with bilateral salpingo-oophorectomy and bilateral pelvic lymph node dissection with Dr. Alycia Rossetti in October 2014.  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  No adjuvant therapy was done.  No pelvic pain.  Unexplained pelvic fractures on 2 different occasions in 2018.  The fractures were documented by MRI and per patient no evidence of malignancy is suspected as the cause.  Started on Prolia 03/2017.  Breasts normal.  Urine and bowel movements normal.  BMI 48.71.  Limited physical activity because of difficult healing of pelvic fractures.  Will start a lower calorie diet.  Past medical history,surgical history, family history and social history were all reviewed and documented in the EPIC chart.  Gynecologic History No LMP recorded. Patient has had a hysterectomy. Contraception: status post hysterectomy Last Pap: 02/2017.  Results were: Negative Last mammogram: 02/2017. Results were: normal.  Scheduling at [facility]: 11/2016 Osteopenia.  But fragility fractures on Prolia x 1 year. Colonoscopy: 11/2017  Obstetric History OB History  Gravida Para Term Preterm AB Living  4 4       4   SAB TAB Ectopic Multiple Live Births               # Outcome Date GA Lbr Len/2nd Weight Sex Delivery Anes PTL Lv  4 Para           3 Para           2 Para           1 Para              ROS: A ROS was performed and pertinent positives and negatives are included in the history.  GENERAL: No fevers or chills. HEENT: No change in vision, no earache, sore throat or sinus congestion. NECK: No pain or stiffness. CARDIOVASCULAR: No chest pain or pressure. No palpitations. PULMONARY: No  shortness of breath, cough or wheeze. GASTROINTESTINAL: No abdominal pain, nausea, vomiting or diarrhea, melena or bright red blood per rectum. GENITOURINARY: No urinary frequency, urgency, hesitancy or dysuria. MUSCULOSKELETAL: No joint or muscle pain, no back pain, no recent trauma. DERMATOLOGIC: No rash, no itching, no lesions. ENDOCRINE: No polyuria, polydipsia, no heat or cold intolerance. No recent change in weight. HEMATOLOGICAL: No anemia or easy bruising or bleeding. NEUROLOGIC: No headache, seizures, numbness, tingling or weakness. PSYCHIATRIC: No depression, no loss of interest in normal activity or change in sleep pattern.     Exam:   BP 120/70   Ht 5\' 3"  (1.6 m)   Wt 275 lb (124.7 kg)   BMI 48.71 kg/m   Body mass index is 48.71 kg/m.  General appearance : Well developed well nourished female. No acute distress HEENT: Eyes: no retinal hemorrhage or exudates,  Neck supple, trachea midline, no carotid bruits, no thyroidmegaly Lungs: Clear to auscultation, no rhonchi or wheezes, or rib retractions  Heart: Regular rate and rhythm, no murmurs or gallops Breast:Examined in sitting and supine position were symmetrical in appearance, no palpable masses or tenderness,  no skin retraction, no nipple inversion, no nipple discharge, no skin discoloration, no axillary or supraclavicular lymphadenopathy Abdomen: no palpable masses or tenderness, no rebound or guarding Extremities:  no edema or skin discoloration or tenderness  Pelvic: Vulva: Normal             Vagina: No gross lesions or discharge.  Pap reflex on Vaginal vault  Cervix/Uterus absent  Adnexa  Without masses or tenderness  Anus: Normal   Assessment/Plan:  73 y.o. female for annual exam   1. Encounter for Papanicolaou smear of vagina as part of routine gynecological examination Gynecologic exam status post total hysterectomy with BSO.  Pap test done at the vaginal vault.  Breast exam normal.  Will schedule screening  mammogram at [facility] now.  Colonoscopy September 2019.  Health labs with family physician.  2. Postmenopause Well on no hormone replacement therapy.    3. Endometrial cancer ([place]) Status post robotic TLH with BSO and pelvic node dissection in 2014.  Stage Ia grade 1 endometrioid adenocarcinoma of the endometrium.  No adjuvant therapy necessary.  4. Age-related osteoporosis without current pathological fracture Clinical osteoporosis given 2 fragility fractures of the pelvis.  Started on Prolia January 2019.  Will have an MRI soon to confirm complete healing of the pelvic fractures.  Vitamin D supplements, calcium intake of 1.5 g/day and resume weightbearing physical activities when confirmation of full healing of the pelvic fractures.  5. Class 3 severe obesity due to excess calories with serious comorbidity and body mass index (BMI) of 45.0 to 49.9 in adult [facility]) Recommend a low calorie/carb diet.  Will start stationary biking.  Recommend upper body light weightlifting every 2 days.  Other orders - telmisartan-hydrochlorothiazide (MICARDIS HCT) 80-12.5 MG tablet; Take 1 tablet by mouth daily.  [name] MD, 4:07 PM 04/07/2018

## 2018-04-07 NOTE — Addendum Note (Signed)
Addended by: Thurnell Garbe A on: 04/07/2018 04:39 PM   Modules accepted: Orders

## 2018-04-08 ENCOUNTER — Encounter: Payer: Self-pay | Admitting: Anesthesiology

## 2018-04-09 LAB — PAP IG W/ RFLX HPV ASCU

## 2018-04-16 DIAGNOSIS — R05 Cough: Secondary | ICD-10-CM | POA: Diagnosis not present

## 2018-04-16 DIAGNOSIS — J209 Acute bronchitis, unspecified: Secondary | ICD-10-CM | POA: Diagnosis not present

## 2018-04-18 ENCOUNTER — Encounter: Payer: Self-pay | Admitting: *Deleted

## 2018-04-24 DIAGNOSIS — M545 Low back pain: Secondary | ICD-10-CM | POA: Diagnosis not present

## 2018-08-03 DIAGNOSIS — A499 Bacterial infection, unspecified: Secondary | ICD-10-CM | POA: Diagnosis not present

## 2018-08-03 DIAGNOSIS — N390 Urinary tract infection, site not specified: Secondary | ICD-10-CM | POA: Diagnosis not present

## 2018-08-03 DIAGNOSIS — R300 Dysuria: Secondary | ICD-10-CM | POA: Diagnosis not present

## 2018-08-03 DIAGNOSIS — R82998 Other abnormal findings in urine: Secondary | ICD-10-CM | POA: Diagnosis not present

## 2018-09-09 DIAGNOSIS — M810 Age-related osteoporosis without current pathological fracture: Secondary | ICD-10-CM | POA: Diagnosis not present

## 2018-09-09 DIAGNOSIS — Z0000 Encounter for general adult medical examination without abnormal findings: Secondary | ICD-10-CM | POA: Diagnosis not present

## 2018-09-09 DIAGNOSIS — I10 Essential (primary) hypertension: Secondary | ICD-10-CM | POA: Diagnosis not present

## 2018-09-09 DIAGNOSIS — E7800 Pure hypercholesterolemia, unspecified: Secondary | ICD-10-CM | POA: Diagnosis not present

## 2018-09-16 DIAGNOSIS — Z0000 Encounter for general adult medical examination without abnormal findings: Secondary | ICD-10-CM | POA: Diagnosis not present

## 2018-09-16 DIAGNOSIS — I10 Essential (primary) hypertension: Secondary | ICD-10-CM | POA: Diagnosis not present

## 2018-09-16 DIAGNOSIS — K219 Gastro-esophageal reflux disease without esophagitis: Secondary | ICD-10-CM | POA: Diagnosis not present

## 2018-11-14 DIAGNOSIS — H25813 Combined forms of age-related cataract, bilateral: Secondary | ICD-10-CM | POA: Diagnosis not present

## 2018-11-14 DIAGNOSIS — H5213 Myopia, bilateral: Secondary | ICD-10-CM | POA: Diagnosis not present

## 2018-11-14 DIAGNOSIS — H35371 Puckering of macula, right eye: Secondary | ICD-10-CM | POA: Diagnosis not present

## 2018-11-27 DIAGNOSIS — H25811 Combined forms of age-related cataract, right eye: Secondary | ICD-10-CM | POA: Diagnosis not present

## 2018-11-27 DIAGNOSIS — H2511 Age-related nuclear cataract, right eye: Secondary | ICD-10-CM | POA: Diagnosis not present

## 2018-12-05 DIAGNOSIS — H35371 Puckering of macula, right eye: Secondary | ICD-10-CM | POA: Diagnosis not present

## 2018-12-12 IMAGING — NM NM BONE WHOLE BODY
6 series · 6 of 6 positions shown · non-contrast
Comparison: CT-guided pelvic needle aspiration 04/23/2017.

CLINICAL DATA: 71-year-old female with pelvic pain. Prior pelvic
fractures. History of endometrial cancer, post TAHBSO, with chronic
indeterminate left-sided pelvic sidewall fluid collection which was
percutaneously sampled with CT guidance at the end [DATE].

EXAM:
NUCLEAR MEDICINE WHOLE BODY BONE SCAN
TECHNIQUE: Whole body anterior and posterior images were obtained approximately
3 hours after intravenous injection of radiopharmaceutical.
RADIOPHARMACEUTICALS:  21.1 mCi 7echnetium-44m MDP IV

[Series 1: whole body · 2.66mm/px · 1 of 1 slices shown (1 of 2)]
[im 1/1]
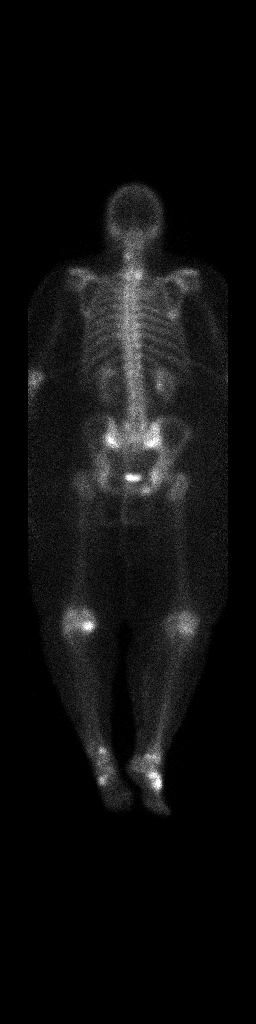

[Series 1: whole body · 2.66mm/px · 1 of 1 slices shown (2 of 2)]
[im 1/1]
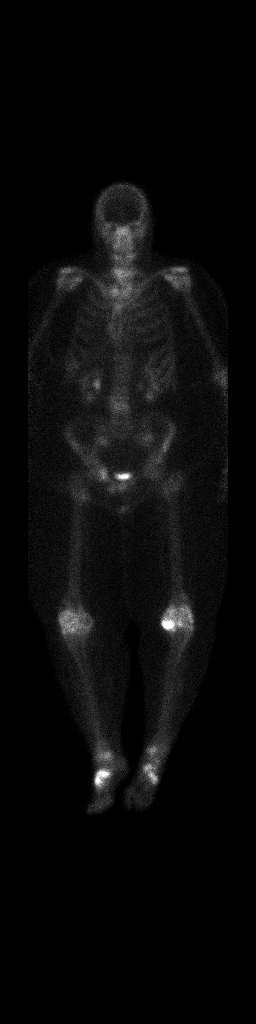

[Series 2: static · 2.07mm/px · 1 of 1 slices shown (1 of 2)]
[im 1/1]
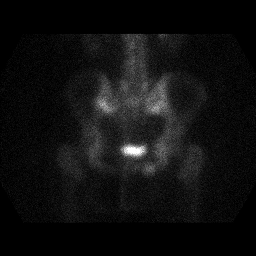

[Series 2: static · 2.07mm/px · 1 of 1 slices shown (2 of 2)]
[im 1/1]
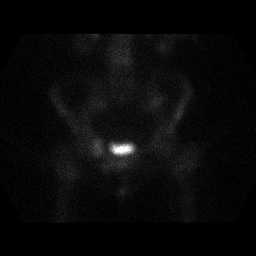

[Series 3: static (1) · 2.07mm/px · 1 of 1 slices shown (1 of 2)]
[im 1/1]
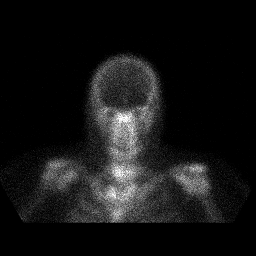

[Series 3: static (1) · 2.07mm/px · 1 of 1 slices shown (2 of 2)]
[im 1/1]
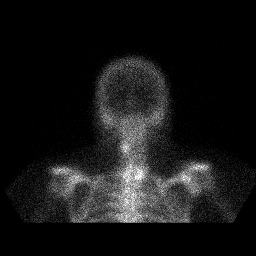

[6 of 6 positions shown; findings below may reference images not displayed]

FINDINGS: Expected radiotracer activity in both kidneys and the urinary
bladder.

Heterogeneous mild to moderate right pubic rami and acetabular
region uptake corresponds to multiple ununited pelvic fractures in
that region on the [place] CT.

Other pelvis radiotracer activity is within normal limits.

Mildly heterogeneous activity in the posterior cervical spine
appears to be degenerative in nature. Other axial skeleton
radiotracer activity, including the skull, is within normal limits.

Visible upper extremity appendicular skeleton activity is within
normal limits. There is multifocal moderate to intense asymmetric
radiotracer activity about the left knee and bilateral ankles which
appears degenerative in nature.
IMPRESSION: 1. Posttraumatic radiotracer activity in the right hemipelvis
related to ununited acetabular and pubic rami fractures seen by CT
on 04/23/2017.
[DATE]. Degenerative appearing radiotracer activity in both lower
extremities, and to a mild degree in the posterior cervical spine.

## 2019-02-06 DIAGNOSIS — Z1231 Encounter for screening mammogram for malignant neoplasm of breast: Secondary | ICD-10-CM | POA: Diagnosis not present

## 2019-02-26 DIAGNOSIS — H35371 Puckering of macula, right eye: Secondary | ICD-10-CM | POA: Diagnosis not present

## 2019-02-26 DIAGNOSIS — H25012 Cortical age-related cataract, left eye: Secondary | ICD-10-CM | POA: Diagnosis not present

## 2019-02-26 DIAGNOSIS — H2512 Age-related nuclear cataract, left eye: Secondary | ICD-10-CM | POA: Diagnosis not present

## 2019-03-09 DIAGNOSIS — Z0000 Encounter for general adult medical examination without abnormal findings: Secondary | ICD-10-CM | POA: Diagnosis not present

## 2019-03-09 DIAGNOSIS — E032 Hypothyroidism due to medicaments and other exogenous substances: Secondary | ICD-10-CM | POA: Diagnosis not present

## 2019-03-09 DIAGNOSIS — E7800 Pure hypercholesterolemia, unspecified: Secondary | ICD-10-CM | POA: Diagnosis not present

## 2019-03-09 DIAGNOSIS — E789 Disorder of lipoprotein metabolism, unspecified: Secondary | ICD-10-CM | POA: Diagnosis not present

## 2019-03-09 DIAGNOSIS — E039 Hypothyroidism, unspecified: Secondary | ICD-10-CM | POA: Diagnosis not present

## 2019-03-09 DIAGNOSIS — I10 Essential (primary) hypertension: Secondary | ICD-10-CM | POA: Diagnosis not present

## 2019-03-16 DIAGNOSIS — E7800 Pure hypercholesterolemia, unspecified: Secondary | ICD-10-CM | POA: Diagnosis not present

## 2019-03-16 DIAGNOSIS — E039 Hypothyroidism, unspecified: Secondary | ICD-10-CM | POA: Diagnosis not present

## 2019-03-16 DIAGNOSIS — H25812 Combined forms of age-related cataract, left eye: Secondary | ICD-10-CM | POA: Diagnosis not present

## 2019-03-16 DIAGNOSIS — I10 Essential (primary) hypertension: Secondary | ICD-10-CM | POA: Diagnosis not present

## 2019-04-01 DIAGNOSIS — H35371 Puckering of macula, right eye: Secondary | ICD-10-CM | POA: Diagnosis not present

## 2019-04-01 HISTORY — PX: EYE SURGERY: SHX253

## 2019-04-08 ENCOUNTER — Other Ambulatory Visit: Payer: Self-pay

## 2019-04-09 ENCOUNTER — Ambulatory Visit (INDEPENDENT_AMBULATORY_CARE_PROVIDER_SITE_OTHER): Payer: Medicare Other | Admitting: Obstetrics & Gynecology

## 2019-04-09 ENCOUNTER — Encounter: Payer: Self-pay | Admitting: Obstetrics & Gynecology

## 2019-04-09 VITALS — BP 124/76 | Ht 63.0 in | Wt 279.0 lb

## 2019-04-09 DIAGNOSIS — Z1272 Encounter for screening for malignant neoplasm of vagina: Secondary | ICD-10-CM

## 2019-04-09 DIAGNOSIS — C541 Malignant neoplasm of endometrium: Secondary | ICD-10-CM

## 2019-04-09 DIAGNOSIS — Z780 Asymptomatic menopausal state: Secondary | ICD-10-CM | POA: Diagnosis not present

## 2019-04-09 DIAGNOSIS — H35371 Puckering of macula, right eye: Secondary | ICD-10-CM | POA: Diagnosis not present

## 2019-04-09 DIAGNOSIS — Z01419 Encounter for gynecological examination (general) (routine) without abnormal findings: Secondary | ICD-10-CM | POA: Diagnosis not present

## 2019-04-09 DIAGNOSIS — Z6841 Body Mass Index (BMI) 40.0 and over, adult: Secondary | ICD-10-CM

## 2019-04-09 DIAGNOSIS — M810 Age-related osteoporosis without current pathological fracture: Secondary | ICD-10-CM | POA: Diagnosis not present

## 2019-04-09 NOTE — Progress Notes (Signed)
Shannon Gardner [birth year] [id number]   History:    74 y.o. G46P4L4  Married  RP: Established patient presenting for Annual Gynecologic visit  HPI: Menopause, well on no hormone replacement therapy.  Status post  robotic total laparoscopic hysterectomy with bilateral salpingo-oophorectomy and bilateral pelvic lymph node dissection with Dr. Alycia Rossetti in October 2014.  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  No adjuvant therapy was done.  No pelvic pain.  Large Pelvic Simple Cyst drained under CT scan 03/2018 Cytology Benign on the drained fluid.  Pap on vagina Negative 03/2018.  Abstinent.  In the past 2 years she had unexplained pelvic fractures on 2 different occasions.  The fractures were documented by MRI and per patient no evidence of malignancy is suspected as the cause.  She is on Prolia.  Urine and bowel movements normal. Breasts normal.  Screening mammo negative 01/2019. Health labs with Fam MD.  Shannon Gardner 2019.   Past medical history,surgical history, family history and social history were all reviewed and documented in the EPIC chart.  Gynecologic History No LMP recorded. Patient has had a hysterectomy.  Obstetric History OB History  Gravida Para Term Preterm AB Living  4 4       4   SAB TAB Ectopic Multiple Live Births               # Outcome Date GA Lbr Len/2nd Weight Sex Delivery Anes PTL Lv  4 Para           3 Para           2 Para           1 Para              ROS: A ROS was performed and pertinent positives and negatives are included in the history.  GENERAL: No fevers or chills. HEENT: No change in vision, no earache, sore throat or sinus congestion. NECK: No pain or stiffness. CARDIOVASCULAR: No chest pain or pressure. No palpitations. PULMONARY: No shortness of breath, cough or wheeze. GASTROINTESTINAL: No abdominal pain, nausea, vomiting or diarrhea, melena or bright red blood per rectum. GENITOURINARY: No  urinary frequency, urgency, hesitancy or dysuria. MUSCULOSKELETAL: No joint or muscle pain, no back pain, no recent trauma. DERMATOLOGIC: No rash, no itching, no lesions. ENDOCRINE: No polyuria, polydipsia, no heat or cold intolerance. No recent change in weight. HEMATOLOGICAL: No anemia or easy bruising or bleeding. NEUROLOGIC: No headache, seizures, numbness, tingling or weakness. PSYCHIATRIC: No depression, no loss of interest in normal activity or change in sleep pattern.     Exam:   BP 124/76   Ht 5\' 3"  (1.6 m)   Wt 279 lb (126.6 kg)   BMI 49.42 kg/m   Body mass index is 49.42 kg/m.  General appearance : Well developed well nourished female. No acute distress HEENT: Eyes: no retinal hemorrhage or exudates,  Neck supple, trachea midline, no carotid bruits, no thyroidmegaly Lungs: Clear to auscultation, no rhonchi or wheezes, or rib retractions  Heart: Regular rate and rhythm, no murmurs or gallops Breast:Examined in sitting and supine position were symmetrical in appearance, no palpable masses or tenderness,  no skin retraction, no nipple inversion, no nipple discharge, no skin discoloration, no axillary or supraclavicular lymphadenopathy Abdomen: no palpable masses or tenderness, no rebound or guarding Extremities: no edema or skin discoloration or tenderness  Pelvic: Vulva: Normal  Vagina: No gross lesions or discharge.  Pap reflex done.  Cervix/Uterus absent  Adnexa  Without masses or tenderness  Anus: Normal   Assessment/Plan:  74 y.o. female for annual exam   1. Encounter for Papanicolaou smear of vagina as part of routine gynecological examination Gynecologic exam status post robotic TLH with bilateral salpingo-oophorectomy.  Pap reflex done on the vaginal vault.  Breast exam normal.  Screening mammogram negative in November 2020.  Colonoscopy in 2019.  Health labs with family physician.  2. Postmenopause Postmenopausal, well on no hormone replacement  therapy.  3. Endometrial cancer [facility]) Robotic total laparoscopic hysterectomy with bilateral salpingo-oophorectomy and bilateral pelvic lymph node dissection with Dr. Alycia Rossetti in October 2014.  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  No adjuvant therapy was done.  No pelvic pain.  Large Pelvic Simple Cyst drained under CT scan 03/2018 Cytology Benign on the drained fluid.   4. Age-related osteoporosis without current pathological fracture Bone density 03/2018.  Had Pelvic fractures in the past 2 yrs.  No current fracture.  On Prolia.  Continue with vitamin D supplements, calcium intake of 1200 mg daily and regular weightbearing physical activities.  5. Class 3 severe obesity due to excess calories with serious comorbidity and body mass index (BMI) of 45.0 to 49.9 in adult [facility]) Recommend a low calorie/carb diet such as [employer].  Aerobic physical activities 5 times a week and weightlifting every 2 days.  Other orders - zinc gluconate 50 MG tablet; Take 50 mg by mouth daily. - Ascorbic Acid (VITAMIN C) 100 MG tablet; Take 100 mg by mouth daily. - gabapentin (NEURONTIN) 100 MG capsule; Take 100 mg by mouth 3 (three) times daily.  [name] MD, 11:08 AM 04/09/2019

## 2019-04-10 LAB — PAP IG W/ RFLX HPV ASCU

## 2019-04-11 ENCOUNTER — Encounter: Payer: Self-pay | Admitting: Obstetrics & Gynecology

## 2019-04-11 NOTE — Patient Instructions (Signed)
1. Encounter for Papanicolaou smear of vagina as part of routine gynecological examination Gynecologic exam status post robotic TLH with bilateral salpingo-oophorectomy.  Pap reflex done on the vaginal vault.  Breast exam normal.  Screening mammogram negative in November 2020.  Colonoscopy in 2019.  Health labs with family physician.  2. Postmenopause Postmenopausal, well on no hormone replacement therapy.  3. Endometrial cancer [facility]) Robotic total laparoscopic hysterectomy with bilateral salpingo-oophorectomy and bilateral pelvic lymph node dissection with Dr. Alycia Rossetti in October 2014.  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  No adjuvant therapy was done.  No pelvic pain.  Large Pelvic Simple Cyst drained under CT scan 03/2018 Cytology Benign on the drained fluid.   4. Age-related osteoporosis without current pathological fracture Bone density 03/2018.  Had Pelvic fractures in the past 2 yrs.  No current fracture.  On Prolia.  Continue with vitamin D supplements, calcium intake of 1200 mg daily and regular weightbearing physical activities.  5. Class 3 severe obesity due to excess calories with serious comorbidity and body mass index (BMI) of 45.0 to 49.9 in adult [facility]) Recommend a low calorie/carb diet such as [employer].  Aerobic physical activities 5 times a week and weightlifting every 2 days.  Other orders - zinc gluconate 50 MG tablet; Take 50 mg by mouth daily. - Ascorbic Acid (VITAMIN C) 100 MG tablet; Take 100 mg by mouth daily. - gabapentin (NEURONTIN) 100 MG capsule; Take 100 mg by mouth 3 (three) times daily.  [place], it was a pleasure seeing you today!  I will inform you of your results as soon as they are available.

## 2019-04-28 DIAGNOSIS — G9601 Cranial cerebrospinal fluid leak, spontaneous: Secondary | ICD-10-CM | POA: Diagnosis not present

## 2019-04-28 DIAGNOSIS — Z9622 Myringotomy tube(s) status: Secondary | ICD-10-CM | POA: Diagnosis not present

## 2019-05-28 DIAGNOSIS — H2512 Age-related nuclear cataract, left eye: Secondary | ICD-10-CM | POA: Diagnosis not present

## 2019-05-28 DIAGNOSIS — H25812 Combined forms of age-related cataract, left eye: Secondary | ICD-10-CM | POA: Diagnosis not present

## 2019-06-09 DIAGNOSIS — M1712 Unilateral primary osteoarthritis, left knee: Secondary | ICD-10-CM | POA: Diagnosis not present

## 2019-06-09 DIAGNOSIS — M25562 Pain in left knee: Secondary | ICD-10-CM | POA: Diagnosis not present

## 2019-06-25 DIAGNOSIS — Z961 Presence of intraocular lens: Secondary | ICD-10-CM | POA: Diagnosis not present

## 2019-09-10 DIAGNOSIS — I10 Essential (primary) hypertension: Secondary | ICD-10-CM | POA: Diagnosis not present

## 2019-09-10 DIAGNOSIS — E039 Hypothyroidism, unspecified: Secondary | ICD-10-CM | POA: Diagnosis not present

## 2019-09-10 DIAGNOSIS — E7800 Pure hypercholesterolemia, unspecified: Secondary | ICD-10-CM | POA: Diagnosis not present

## 2019-09-29 ENCOUNTER — Other Ambulatory Visit: Payer: Self-pay

## 2019-09-29 ENCOUNTER — Encounter (INDEPENDENT_AMBULATORY_CARE_PROVIDER_SITE_OTHER): Payer: Self-pay | Admitting: Ophthalmology

## 2019-09-29 ENCOUNTER — Ambulatory Visit (INDEPENDENT_AMBULATORY_CARE_PROVIDER_SITE_OTHER): Payer: Medicare Other | Admitting: Ophthalmology

## 2019-09-29 DIAGNOSIS — H35371 Puckering of macula, right eye: Secondary | ICD-10-CM | POA: Diagnosis not present

## 2019-09-29 DIAGNOSIS — Z9889 Other specified postprocedural states: Secondary | ICD-10-CM | POA: Diagnosis not present

## 2019-09-29 NOTE — Progress Notes (Signed)
09/29/2019     CHIEF COMPLAINT Patient presents for Retina Follow Up   HISTORY OF PRESENT ILLNESS: Shannon Gardner is a 74 y.o. female who presents to the clinic today for:   HPI    Retina Follow Up    Patient presents with  Other.  In both eyes.  Duration of 5 months.  Since onset it is stable.          Comments    5 month f.u - OCT OU (PO OD - Vitrectomy w/ ILM 04/01/19) Patient denies change in vision and overall has no complaints.        Last edited by Gerda Diss on 09/29/2019  8:18 AM. (History)      Referring physician: Jani Gravel, MD [address] [place],  [place] [address]  HISTORICAL INFORMATION:   Selected notes from the [facility]: No current outpatient medications on file. (Ophthalmic Drugs)   No current facility-administered medications for this visit. (Ophthalmic Drugs)   Current Outpatient Medications (Other)  Medication Sig  . amLODipine (NORVASC) 2.5 MG tablet Take 2.5 mg by mouth daily.  . Ascorbic Acid (VITAMIN C) 100 MG tablet Take 100 mg by mouth daily.  Marland Kitchen aspirin 81 MG tablet Take 81 mg by mouth every evening.   . celecoxib (CELEBREX) 200 MG capsule Take 200 mg by mouth daily as needed for mild pain.  . Cholecalciferol (D 10000) 10000 units CAPS Take by mouth.  . ezetimibe (ZETIA) 10 MG tablet Take 10 mg by mouth at bedtime.  . gabapentin (NEURONTIN) 100 MG capsule Take 100 mg by mouth 3 (three) times daily.  Marland Kitchen ibuprofen (ADVIL,MOTRIN) 200 MG tablet Take 100-200 mg by mouth every 6 (six) hours as needed for moderate pain.   Marland Kitchen levothyroxine (SYNTHROID, LEVOTHROID) 112 MCG tablet Take 112 mcg by mouth daily before breakfast. No Substitutions " brand name Synthroid only"  . Probiotic Product (ALIGN) 4 MG CAPS Take 1 capsule by mouth at bedtime.   Marland Kitchen telmisartan-hydrochlorothiazide (MICARDIS HCT) 80-12.5 MG tablet Take 1 tablet by mouth daily.  Marland Kitchen zinc gluconate 50 MG tablet Take 50 mg by  mouth daily.   No current facility-administered medications for this visit. (Other)      REVIEW OF SYSTEMS:    ALLERGIES Allergies  Allergen Reactions  . Altace [Ramipril] Hives  . Tussicaps [Hydrocod Polst-Cpm Polst Er] Rash    PAST MEDICAL HISTORY Past Medical History:  Diagnosis Date  . Cataract    not surgical yet  . Fractured pelvis ([place])   . GERD (gastroesophageal reflux disease)   . Headache(784.0)    migraines "cluster headaches"-most days  . High cholesterol   . Hypertension   . Hypothyroidism   . PONV (postoperative nausea and vomiting)   . Thyroid disease    Past Surgical History:  Procedure Laterality Date  . CATARACT EXTRACTION Right   . EYE SURGERY Right 04/01/2019   Vitrectomy w/ ILM  . LAPAROSCOPIC CHOLECYSTECTOMY  2000?  Marland Kitchen Left knee surg  2009   "Torn meniscus"   . ORIF ULNAR FRACTURE Right 12/28/2013   Procedure: OPEN REDUCTION INTERNAL FIXATION (ORIF) ULNAR AND RADIAL FRACTURE;  Surgeon: Charlotte Crumb, MD;  Location: [facility];  Service: Orthopedics;  Laterality: Right;  . ROBOTIC ASSISTED TOTAL HYSTERECTOMY WITH BILATERAL SALPINGO OOPHERECTOMY Bilateral 01/20/2013   Procedure: ROBOTIC ASSISTED TOTAL ABDOMINAL HYSTERECTOMY WITH BILATERAL SALPINGO OOPHORECTOMY PELVIC LYMPH NODE DISSECTION;  Surgeon: Imagene Gurney A. Alycia Rossetti, MD;  Location: [facility];  Service: Gynecology;  Laterality: Bilateral;  . TONSILECTOMY, ADENOIDECTOMY, BILATERAL MYRINGOTOMY AND TUBES  1955    FAMILY HISTORY Family History  Problem Relation Age of Onset  . Cancer Mother        leomasarcoma   . Cancer Sister        lung - smoker  . Cancer Brother        prostate-   . Heart attack Father     SOCIAL HISTORY Social History   Tobacco Use  . Smoking status: Former Smoker    Years: 12.00    Start date: 03/26/1974    Quit date: 03/26/1988    Years since quitting: 31.5  . Smokeless tobacco: Never Used  Vaping Use  . Vaping Use: Never used  Substance Use Topics  . Alcohol use: Yes     Comment: rare occ " 3-4 times a year"   . Drug use: No         OPHTHALMIC EXAM:  Base Eye Exam    Visual Acuity (Snellen - Linear)      Right Left   Dist Oto 20/25+2 20/20-1       Tonometry (Tonopen, 8:11 AM)      Right Left   Pressure 5 8       Pupils      Pupils Dark Light Shape React APD   Right PERRL 5 4 Round Brisk None   Left PERRL 5 4 Round Brisk None       Visual Fields (Counting fingers)      Left Right    Full Full       Extraocular Movement      Right Left    Full Full       Neuro/Psych    Oriented x3: Yes   Mood/Affect: Normal       Dilation    Both eyes: 1.0% Mydriacyl, 2.5% Phenylephrine @ 8:11 AM        Slit Lamp and Fundus Exam    External Exam      Right Left   External Normal Normal       Slit Lamp Exam      Right Left   Lids/Lashes Normal Normal   Conjunctiva/Sclera White and quiet White and quiet   Cornea Clear Clear   Anterior Chamber Deep and quiet Deep and quiet   Iris Round and reactive Round and reactive   Lens Posterior chamber intraocular lens Posterior chamber intraocular lens   Anterior Vitreous Normal Normal       Fundus Exam      Right Left   Posterior Vitreous Vitrectomized Posterior vitreous detachment   Disc Normal Normal   C/D Ratio 0.25 0.25   Macula Normal Normal   Vessels Normal Normal   Periphery Normal Normal          IMAGING AND PROCEDURES  Imaging and Procedures for 09/29/19  OCT, Retina - OU - Both Eyes       Right Eye Quality was good. Scan locations included subfoveal. Central Foveal Thickness: 343. Progression has improved.   Left Eye Quality was good. Scan locations included subfoveal. Central Foveal Thickness: 287. Progression has been stable. Findings include normal observations.   Notes OD with only minor residual thickening from prior vitrectomy membrane peel for severe epiretinal membrane.                ASSESSMENT/PLAN:  No problem-specific Assessment & Plan  notes found for this encounter.  ICD-10-CM   1. Macular pucker, right eye  H35.371 OCT, Retina - OU - Both Eyes  2. History of vitrectomy  Z98.890     1.  OD is continued to improve with now 20/25 vision  2.  3.  Ophthalmic Meds Ordered this visit:  No orders of the defined types were placed in this encounter.      Return if symptoms worsen or fail to improve, for ,, And for routine follow-up with Dr. Luberta Mutter.  There are no Patient Instructions on file for this visit.   Explained the diagnoses, plan, and follow up with the patient and they expressed understanding.  Patient expressed understanding of the importance of proper follow up care.   Clent Demark Laverna Dossett M.D. Diseases & Surgery of the Retina and Vitreous Retina & Diabetic [place] 09/29/19     Abbreviations: M myopia (nearsighted); A astigmatism; H hyperopia (farsighted); P presbyopia; Mrx spectacle prescription;  CTL contact lenses; OD right eye; OS left eye; OU both eyes  XT exotropia; ET esotropia; PEK punctate epithelial keratitis; PEE punctate epithelial erosions; DES dry eye syndrome; MGD meibomian gland dysfunction; ATs artificial tears; PFAT's preservative free artificial tears; [place] nuclear sclerotic cataract; PSC posterior subcapsular cataract; ERM epi-retinal membrane; PVD posterior vitreous detachment; RD retinal detachment; DM diabetes mellitus; DR diabetic retinopathy; NPDR non-proliferative diabetic retinopathy; PDR proliferative diabetic retinopathy; CSME clinically significant macular edema; DME diabetic macular edema; dbh dot blot hemorrhages; CWS cotton wool spot; POAG primary open angle glaucoma; C/D cup-to-disc ratio; HVF humphrey visual field; GVF goldmann visual field; OCT optical coherence tomography; IOP intraocular pressure; BRVO Branch retinal vein occlusion; CRVO central retinal vein occlusion; CRAO central retinal artery occlusion; BRAO branch retinal artery occlusion; RT retinal tear;  SB scleral buckle; PPV pars plana vitrectomy; VH Vitreous hemorrhage; PRP panretinal laser photocoagulation; IVK intravitreal kenalog; VMT vitreomacular traction; MH Macular hole;  NVD neovascularization of the disc; NVE neovascularization elsewhere; AREDS age related eye disease study; ARMD age related macular degeneration; POAG primary open angle glaucoma; EBMD epithelial/anterior basement membrane dystrophy; ACIOL anterior chamber intraocular lens; IOL intraocular lens; PCIOL posterior chamber intraocular lens; Phaco/IOL phacoemulsification with intraocular lens placement; [place] photorefractive keratectomy; LASIK laser assisted in situ keratomileusis; HTN hypertension; DM diabetes mellitus; COPD chronic obstructive pulmonary disease

## 2019-09-30 DIAGNOSIS — E039 Hypothyroidism, unspecified: Secondary | ICD-10-CM | POA: Diagnosis not present

## 2019-09-30 DIAGNOSIS — Z0000 Encounter for general adult medical examination without abnormal findings: Secondary | ICD-10-CM | POA: Diagnosis not present

## 2019-09-30 DIAGNOSIS — E7800 Pure hypercholesterolemia, unspecified: Secondary | ICD-10-CM | POA: Diagnosis not present

## 2019-10-15 DIAGNOSIS — R011 Cardiac murmur, unspecified: Secondary | ICD-10-CM | POA: Diagnosis not present

## 2019-11-15 DIAGNOSIS — R05 Cough: Secondary | ICD-10-CM | POA: Diagnosis not present

## 2019-11-17 ENCOUNTER — Other Ambulatory Visit: Payer: Self-pay | Admitting: Physician Assistant

## 2019-11-17 DIAGNOSIS — U071 COVID-19: Secondary | ICD-10-CM

## 2019-11-17 DIAGNOSIS — C541 Malignant neoplasm of endometrium: Secondary | ICD-10-CM

## 2019-11-17 NOTE — Progress Notes (Signed)
I connected by phone with Shannon Gardner on 11/17/2019 at 12:55 PM to discuss the potential use of a new treatment for mild to moderate COVID-19 viral infection in non-hospitalized patients.  This patient is a 73 y.o. female that meets the FDA criteria for Emergency Use Authorization of COVID monoclonal antibody casirivimab/imdevimab.  Has a (+) direct SARS-CoV-2 viral test result  Has mild or moderate COVID-19   Is NOT hospitalized due to COVID-19  Is within 10 days of symptom onset  Has at least one of the high risk factor(s) for progression to severe COVID-19 and/or hospitalization as defined in EUA.  Specific high risk criteria : Older age (>/= 75 yo) and Immunosuppressive Disease or Treatment   I have spoken and communicated the following to the patient or parent/caregiver regarding COVID monoclonal antibody treatment:  1. FDA has authorized the emergency use for the treatment of mild to moderate COVID-19 in adults and pediatric patients with positive results of direct SARS-CoV-2 viral testing who are 70 years of age and older weighing at least 40 kg, and who are at high risk for progressing to severe COVID-19 and/or hospitalization.  2. The significant known and potential risks and benefits of COVID monoclonal antibody, and the extent to which such potential risks and benefits are unknown.  3. Information on available alternative treatments and the risks and benefits of those alternatives, including clinical trials.  4. Patients treated with COVID monoclonal antibody should continue to self-isolate and use infection control measures (e.g., wear mask, isolate, social distance, avoid sharing personal items, clean and disinfect "high touch" surfaces, and frequent handwashing) according to CDC guidelines.   5. The patient or parent/caregiver has the option to accept or refuse COVID monoclonal antibody treatment.  After reviewing this information with the patient, The patient  agreed to proceed with receiving casirivimab\imdevimab infusion and will be provided a copy of the Fact sheet prior to receiving the infusion.  Sx onset 8/20. Set up for infusion on 8/25 @ 10:30am. Directions given to [facility]. Pt is aware that insurance will be charged an infusion fee. Pt is not vaccinated.   [name] 11/17/2019 12:55 PM

## 2019-11-18 ENCOUNTER — Ambulatory Visit (HOSPITAL_COMMUNITY)
Admission: RE | Admit: 2019-11-18 | Discharge: 2019-11-18 | Disposition: A | Discharge status: Home / Self Care | Payer: Medicare Other | Source: Ambulatory Visit | Attending: Pulmonary Disease | Admitting: Pulmonary Disease

## 2019-11-18 DIAGNOSIS — Z23 Encounter for immunization: Secondary | ICD-10-CM | POA: Insufficient documentation

## 2019-11-18 DIAGNOSIS — C541 Malignant neoplasm of endometrium: Secondary | ICD-10-CM

## 2019-11-18 DIAGNOSIS — U071 COVID-19: Secondary | ICD-10-CM | POA: Diagnosis present

## 2019-11-18 MED ORDER — METHYLPREDNISOLONE SODIUM SUCC 125 MG IJ SOLR: 125.0000 mg | Freq: Once | INTRAMUSCULAR | Status: DC | PRN

## 2019-11-18 MED ORDER — SODIUM CHLORIDE 0.9 % IV SOLN
1200.0000 mg | Freq: Once | INTRAVENOUS | Status: AC
  Administered 2019-11-18: 1200 mg via INTRAVENOUS
  Filled 2019-11-18: qty 10

## 2019-11-18 MED ORDER — SODIUM CHLORIDE 0.9 % IV SOLN: INTRAVENOUS | Status: DC | PRN

## 2019-11-18 MED ORDER — DIPHENHYDRAMINE HCL 50 MG/ML IJ SOLN: 50.0000 mg | Freq: Once | INTRAMUSCULAR | Status: DC | PRN

## 2019-11-18 MED ORDER — FAMOTIDINE IN NACL 20-0.9 MG/50ML-% IV SOLN: 20.0000 mg | Freq: Once | INTRAVENOUS | Status: DC | PRN

## 2019-11-18 MED ORDER — ALBUTEROL SULFATE HFA 108 (90 BASE) MCG/ACT IN AERS: 2.0000 | INHALATION_SPRAY | Freq: Once | RESPIRATORY_TRACT | Status: DC | PRN

## 2019-11-18 MED ORDER — EPINEPHRINE 0.3 MG/0.3ML IJ SOAJ: 0.3000 mg | Freq: Once | INTRAMUSCULAR | Status: DC | PRN

## 2019-11-18 NOTE — Discharge Instructions (Signed)

## 2019-11-18 NOTE — Progress Notes (Signed)
  Diagnosis: COVID-19  Physician:Dr Joya Gaskins  Procedure: Covid Infusion Clinic Med: casirivimab\imdevimab infusion - Provided patient with casirivimab\imdevimab fact sheet for patients, parents and caregivers prior to infusion.  Complications: No immediate complications noted.  Discharge: Discharged home   [place], [place] 11/18/2019

## 2020-03-02 DIAGNOSIS — Z03818 Encounter for observation for suspected exposure to other biological agents ruled out: Secondary | ICD-10-CM | POA: Diagnosis not present

## 2020-03-30 DIAGNOSIS — Z20822 Contact with and (suspected) exposure to covid-19: Secondary | ICD-10-CM | POA: Diagnosis not present

## 2020-03-30 DIAGNOSIS — U071 COVID-19: Secondary | ICD-10-CM | POA: Diagnosis not present

## 2020-04-05 DIAGNOSIS — U071 COVID-19: Secondary | ICD-10-CM | POA: Diagnosis not present

## 2020-04-05 DIAGNOSIS — Z9189 Other specified personal risk factors, not elsewhere classified: Secondary | ICD-10-CM | POA: Diagnosis not present

## 2020-04-11 ENCOUNTER — Encounter: Payer: Medicare Other | Admitting: Obstetrics & Gynecology

## 2020-04-15 ENCOUNTER — Ambulatory Visit: Payer: Medicare Other | Admitting: Obstetrics & Gynecology

## 2020-05-04 ENCOUNTER — Encounter: Payer: Self-pay | Admitting: Obstetrics & Gynecology

## 2020-05-04 DIAGNOSIS — E039 Hypothyroidism, unspecified: Secondary | ICD-10-CM | POA: Diagnosis not present

## 2020-05-04 DIAGNOSIS — E559 Vitamin D deficiency, unspecified: Secondary | ICD-10-CM | POA: Diagnosis not present

## 2020-05-04 DIAGNOSIS — E7800 Pure hypercholesterolemia, unspecified: Secondary | ICD-10-CM | POA: Diagnosis not present

## 2020-05-04 DIAGNOSIS — I10 Essential (primary) hypertension: Secondary | ICD-10-CM | POA: Diagnosis not present

## 2020-05-04 DIAGNOSIS — Z1231 Encounter for screening mammogram for malignant neoplasm of breast: Secondary | ICD-10-CM | POA: Diagnosis not present

## 2020-05-11 DIAGNOSIS — R7309 Other abnormal glucose: Secondary | ICD-10-CM | POA: Diagnosis not present

## 2020-05-11 DIAGNOSIS — E7800 Pure hypercholesterolemia, unspecified: Secondary | ICD-10-CM | POA: Diagnosis not present

## 2020-05-11 DIAGNOSIS — E039 Hypothyroidism, unspecified: Secondary | ICD-10-CM | POA: Diagnosis not present

## 2020-05-11 DIAGNOSIS — I129 Hypertensive chronic kidney disease with stage 1 through stage 4 chronic kidney disease, or unspecified chronic kidney disease: Secondary | ICD-10-CM | POA: Diagnosis not present

## 2020-05-11 DIAGNOSIS — M170 Bilateral primary osteoarthritis of knee: Secondary | ICD-10-CM | POA: Diagnosis not present

## 2020-05-18 ENCOUNTER — Ambulatory Visit (INDEPENDENT_AMBULATORY_CARE_PROVIDER_SITE_OTHER): Payer: Medicare Other | Admitting: Obstetrics & Gynecology

## 2020-05-18 ENCOUNTER — Other Ambulatory Visit: Payer: Self-pay

## 2020-05-18 ENCOUNTER — Encounter: Payer: Self-pay | Admitting: Obstetrics & Gynecology

## 2020-05-18 VITALS — BP 122/78 | HR 86 | Resp 20 | Ht 63.39 in | Wt 283.8 lb

## 2020-05-18 DIAGNOSIS — Z1272 Encounter for screening for malignant neoplasm of vagina: Secondary | ICD-10-CM

## 2020-05-18 DIAGNOSIS — Z780 Asymptomatic menopausal state: Secondary | ICD-10-CM | POA: Diagnosis not present

## 2020-05-18 DIAGNOSIS — Z01419 Encounter for gynecological examination (general) (routine) without abnormal findings: Secondary | ICD-10-CM | POA: Diagnosis not present

## 2020-05-18 DIAGNOSIS — Z6841 Body Mass Index (BMI) 40.0 and over, adult: Secondary | ICD-10-CM

## 2020-05-18 DIAGNOSIS — C541 Malignant neoplasm of endometrium: Secondary | ICD-10-CM | POA: Diagnosis not present

## 2020-05-18 DIAGNOSIS — M810 Age-related osteoporosis without current pathological fracture: Secondary | ICD-10-CM

## 2020-05-18 NOTE — Addendum Note (Signed)
Addended by: Burnice Logan on: 05/18/2020 04:31 PM   Modules accepted: Orders

## 2020-05-18 NOTE — Progress Notes (Signed)
Shannon Gardner 1945-11-09 [id number]   History:    75 y.o. G77P4L4  Married  VQ:[id number] patient presenting for Annual Gynecologic visit  TOI:[id number],SNKN on no hormone replacement therapy. Status post robotic total laparoscopic hysterectomy with bilateral salpingo-oophorectomy and bilateral pelvic lymph node dissection with Dr. Alycia Rossetti in October 2014. 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 No adjuvant therapy was done. No pelvic pain.  Pap on vagina Negative 03/2018.  Abstinent. In the past 2 years she had unexplained pelvic fractures on 2 different occasions. The fractures were documented by MRI and per patient no evidence of malignancy is suspected as the cause.  She stopped the Prolia because of side effects. Last BD 03/2018.  Urine and bowel movements normal. Breasts normal. Screening mammo done 05/03/2020 at [facility], will obtain report. Health labs with Fam MD.  [name] 2019.  Past medical history,surgical history, family history and social history were all reviewed and documented in the EPIC chart.  Gynecologic History No LMP recorded. Patient has had a hysterectomy.  Obstetric History OB History  Gravida Para Term Preterm AB Living  4 4       4   SAB IAB Ectopic Multiple Live Births               # Outcome Date GA Lbr Len/2nd Weight Sex Delivery Anes PTL Lv  4 Para           3 Para           2 Para           1 Para              ROS: A ROS was performed and pertinent positives and negatives are included in the history.  GENERAL: No fevers or chills. HEENT: No change in vision, no earache, sore throat or sinus congestion. NECK: No pain or stiffness. CARDIOVASCULAR: No chest pain or pressure. No palpitations. PULMONARY: No shortness of breath, cough or wheeze. GASTROINTESTINAL: No abdominal pain, nausea, vomiting or diarrhea, melena or bright red blood per rectum. GENITOURINARY: No urinary frequency, urgency,  hesitancy or dysuria. MUSCULOSKELETAL: No joint or muscle pain, no back pain, no recent trauma. DERMATOLOGIC: No rash, no itching, no lesions. ENDOCRINE: No polyuria, polydipsia, no heat or cold intolerance. No recent change in weight. HEMATOLOGICAL: No anemia or easy bruising or bleeding. NEUROLOGIC: No headache, seizures, numbness, tingling or weakness. PSYCHIATRIC: No depression, no loss of interest in normal activity or change in sleep pattern.     Exam:   BP 122/78 (BP Location: Left Arm)   Pulse 86   Resp 20   Ht 5' 3.39" (1.61 m)   Wt 283 lb 12.8 oz (128.7 kg)   BMI 49.66 kg/m   Body mass index is 49.66 kg/m.  General appearance : Well developed well nourished female. No acute distress HEENT: Eyes: no retinal hemorrhage or exudates,  Neck supple, trachea midline, no carotid bruits, no thyroidmegaly Lungs: Clear to auscultation, no rhonchi or wheezes, or rib retractions  Heart: Regular rate and rhythm, no murmurs or gallops Breast:Examined in sitting and supine position were symmetrical in appearance, no palpable masses or tenderness,  no skin retraction, no nipple inversion, no nipple discharge, no skin discoloration, no axillary or supraclavicular lymphadenopathy Abdomen: no palpable masses or tenderness, no rebound or guarding Extremities: no edema or skin discoloration or tenderness  Pelvic: Vulva: Normal             Vagina:  No gross lesions or discharge.  Pap reflex done on the vaginal vault.  Cervix/Uterus absent  Adnexa  Without masses or tenderness  Anus: Normal   Assessment/Plan:  75 y.o. female for annual exam   1. Encounter for Papanicolaou smear of vagina as part of routine gynecological examination Gyn exam s/p TLH/BSO/Staging.  Pap reflex done on the Vaginal vault.  Breasts normal.  Obtain report of Mammo at [facility] 04/2020.  [place] 2019.  Health labs with Fam MD.  2. Endometrial cancer ([place]) S/P XI Robotic TLH/BSO/LN staging with Dr Alycia Rossetti in 2014.    3.  Postmenopause Well on no HRT.  4. Age-related osteoporosis without current pathological fracture Needs to repeat a BD now, will organize thru her Fam MD.  Not on Prolia anymore.  Recommend Vit D supplements, Ca++ 1.5 g/d, regular weight bearing physical acitivities.  5. Class 3 severe obesity due to excess calories with serious comorbidity and body mass index (BMI) of 45.0 to 49.9 in adult [facility]) Lower calorie/carb diet.  Increase fitness activities.  [name] MD, 3:21 PM 05/18/2020

## 2020-05-23 LAB — PAP IG W/ RFLX HPV ASCU

## 2020-05-27 ENCOUNTER — Encounter: Payer: Self-pay | Admitting: *Deleted

## 2020-06-14 DIAGNOSIS — Z9622 Myringotomy tube(s) status: Secondary | ICD-10-CM | POA: Diagnosis not present

## 2020-06-14 DIAGNOSIS — G9601 Cranial cerebrospinal fluid leak, spontaneous: Secondary | ICD-10-CM | POA: Diagnosis not present

## 2020-06-29 DIAGNOSIS — Z961 Presence of intraocular lens: Secondary | ICD-10-CM | POA: Diagnosis not present

## 2020-06-29 DIAGNOSIS — H52203 Unspecified astigmatism, bilateral: Secondary | ICD-10-CM | POA: Diagnosis not present

## 2020-10-19 ENCOUNTER — Other Ambulatory Visit: Payer: Self-pay

## 2020-10-19 ENCOUNTER — Emergency Department (HOSPITAL_COMMUNITY): Payer: Medicare Other

## 2020-10-19 ENCOUNTER — Encounter (HOSPITAL_COMMUNITY): Payer: Self-pay

## 2020-10-19 ENCOUNTER — Emergency Department (HOSPITAL_COMMUNITY)
Admission: EM | Admit: 2020-10-19 | Discharge: 2020-10-20 | Disposition: A | Discharge status: Home / Self Care | Payer: Medicare Other | Attending: Emergency Medicine | Admitting: Emergency Medicine

## 2020-10-19 DIAGNOSIS — Z87891 Personal history of nicotine dependence: Secondary | ICD-10-CM | POA: Insufficient documentation

## 2020-10-19 DIAGNOSIS — S43015A Anterior dislocation of left humerus, initial encounter: Secondary | ICD-10-CM | POA: Diagnosis not present

## 2020-10-19 DIAGNOSIS — Z743 Need for continuous supervision: Secondary | ICD-10-CM | POA: Diagnosis not present

## 2020-10-19 DIAGNOSIS — M79603 Pain in arm, unspecified: Secondary | ICD-10-CM | POA: Diagnosis not present

## 2020-10-19 DIAGNOSIS — W01198A Fall on same level from slipping, tripping and stumbling with subsequent striking against other object, initial encounter: Secondary | ICD-10-CM | POA: Diagnosis not present

## 2020-10-19 DIAGNOSIS — M25519 Pain in unspecified shoulder: Secondary | ICD-10-CM | POA: Diagnosis not present

## 2020-10-19 DIAGNOSIS — Z79899 Other long term (current) drug therapy: Secondary | ICD-10-CM | POA: Diagnosis not present

## 2020-10-19 DIAGNOSIS — E039 Hypothyroidism, unspecified: Secondary | ICD-10-CM | POA: Insufficient documentation

## 2020-10-19 DIAGNOSIS — S4992XA Unspecified injury of left shoulder and upper arm, initial encounter: Secondary | ICD-10-CM | POA: Diagnosis not present

## 2020-10-19 DIAGNOSIS — Z8542 Personal history of malignant neoplasm of other parts of uterus: Secondary | ICD-10-CM | POA: Diagnosis not present

## 2020-10-19 DIAGNOSIS — Z7982 Long term (current) use of aspirin: Secondary | ICD-10-CM | POA: Diagnosis not present

## 2020-10-19 DIAGNOSIS — S42292A Other displaced fracture of upper end of left humerus, initial encounter for closed fracture: Secondary | ICD-10-CM

## 2020-10-19 DIAGNOSIS — I10 Essential (primary) hypertension: Secondary | ICD-10-CM | POA: Diagnosis not present

## 2020-10-19 DIAGNOSIS — R0902 Hypoxemia: Secondary | ICD-10-CM | POA: Diagnosis not present

## 2020-10-19 DIAGNOSIS — M7989 Other specified soft tissue disorders: Secondary | ICD-10-CM | POA: Diagnosis not present

## 2020-10-19 DIAGNOSIS — S42292B Other displaced fracture of upper end of left humerus, initial encounter for open fracture: Secondary | ICD-10-CM | POA: Diagnosis not present

## 2020-10-19 DIAGNOSIS — W19XXXA Unspecified fall, initial encounter: Secondary | ICD-10-CM

## 2020-10-19 MED ORDER — PROPOFOL 10 MG/ML IV BOLUS
0.5000 mg/kg | Freq: Once | INTRAVENOUS | Status: DC
  Filled 2020-10-19: qty 20

## 2020-10-19 MED ORDER — OXYCODONE-ACETAMINOPHEN 5-325 MG PO TABS
2.0000 | ORAL_TABLET | Freq: Once | ORAL | Status: AC
  Administered 2020-10-19: 2 via ORAL
  Filled 2020-10-19: qty 2

## 2020-10-19 MED ORDER — OXYCODONE-ACETAMINOPHEN 5-325 MG PO TABS: 1.0000 | ORAL_TABLET | Freq: Four times a day (QID) | ORAL | 0 refills | Status: AC | PRN

## 2020-10-19 MED ORDER — HYDROMORPHONE HCL 1 MG/ML IJ SOLN
1.0000 mg | Freq: Once | INTRAMUSCULAR | Status: AC
  Administered 2020-10-20: 1 mg via INTRAVENOUS
  Filled 2020-10-19: qty 1

## 2020-10-19 MED ORDER — FENTANYL CITRATE (PF) 100 MCG/2ML IJ SOLN
50.0000 ug | Freq: Once | INTRAMUSCULAR | Status: AC
  Administered 2020-10-19: 50 ug via INTRAVENOUS
  Filled 2020-10-19: qty 2

## 2020-10-19 MED ORDER — ONDANSETRON HCL 4 MG/2ML IJ SOLN
4.0000 mg | Freq: Once | INTRAMUSCULAR | Status: AC
  Administered 2020-10-19: 4 mg via INTRAVENOUS

## 2020-10-19 NOTE — ED Triage Notes (Signed)
Patient had a fall and landed on her left arm. Patient has had 200 mcg of fentanyl and 4 mg of zofran.

## 2020-10-19 NOTE — Discharge Instructions (Addendum)
Call [facility] tomorrow morning to ensure you are seen in clinic for your humerus fracture.

## 2020-10-19 NOTE — ED Notes (Signed)
Ice pack applied to L upper arm.

## 2020-10-19 NOTE — ED Notes (Signed)
Pt back from x-ray. Reporting nausea. No provider has signed up for pt. Pt will be updated of delay.

## 2020-10-19 NOTE — ED Notes (Signed)
Pt will not get procedural sedation done. ERP made pt aware.

## 2020-10-19 NOTE — ED Notes (Signed)
Consent signed by daughter for procedural sedation procedure

## 2020-10-19 NOTE — ED Provider Notes (Signed)
[facility] DEPT Provider Note   CSN: [id number] Arrival date & time: 10/19/20  2022     History Chief Complaint  Patient presents with   Shannon Gardner is a 75 y.o. female.   Fall This is a new problem. The current episode started 3 to 5 hours ago. Pertinent negatives include no chest pain, no abdominal pain, no headaches and no shortness of breath.   75 year old female presenting to the emergency department after a ground-level mechanical fall earlier today.  The patient states that she caught her foot and tripped falling onto a hard surface.  She denies any head trauma or loss of consciousness.  She states that she is not on anticoagulation.  She landed on her left shoulder and sustained immediate pain and deformity and inability to range the left shoulder.  She arrived to [facility] GCS 15, ABC intact, complaining of left shoulder pain.  Past Medical History:  Diagnosis Date   Cataract    not surgical yet   Fractured pelvis ([place])    GERD (gastroesophageal reflux disease)    Headache(784.0)    migraines "cluster headaches"-most days   High cholesterol    Hypertension    Hypothyroidism    PONV (postoperative nausea and vomiting)    Thyroid disease     Patient Active Problem List   Diagnosis Date Noted   History of vitrectomy 09/29/2019   Macular pucker, right eye 09/29/2019   Endometrial cancer ([place]) 12/30/2012    Past Surgical History:  Procedure Laterality Date   CATARACT EXTRACTION Right    EYE SURGERY Right 04/01/2019   Vitrectomy w/ ILM   LAPAROSCOPIC CHOLECYSTECTOMY  2000?   Left knee surg  2009   "Torn meniscus"    ORIF ULNAR FRACTURE Right 12/28/2013   Procedure: OPEN REDUCTION INTERNAL FIXATION (ORIF) ULNAR AND RADIAL FRACTURE;  Surgeon: Charlotte Crumb, MD;  Location: [facility];  Service: Orthopedics;  Laterality: Right;   ROBOTIC ASSISTED TOTAL HYSTERECTOMY WITH BILATERAL SALPINGO OOPHERECTOMY Bilateral  01/20/2013   Procedure: ROBOTIC ASSISTED TOTAL ABDOMINAL HYSTERECTOMY WITH BILATERAL SALPINGO OOPHORECTOMY PELVIC LYMPH NODE DISSECTION;  Surgeon: Imagene Gurney A. Alycia Rossetti, MD;  Location: [facility];  Service: Gynecology;  Laterality: Bilateral;   TONSILECTOMY, ADENOIDECTOMY, BILATERAL MYRINGOTOMY AND TUBES  1955     OB History     Gravida  4   Para  4   Term      Preterm      AB      Living  4      SAB      IAB      Ectopic      Multiple      Live Births              Family History  Problem Relation Age of Onset   Cancer Mother        leomasarcoma    Cancer Sister        lung - smoker   Cancer Brother        prostate-    Heart attack Father     Social History   Tobacco Use   Smoking status: Former    Years: 12.00    Types: Cigarettes    Start date: 03/26/1974    Quit date: 03/26/1988    Years since quitting: 32.5   Smokeless tobacco: Never  Vaping Use   Vaping Use: Never used  Substance Use Topics   Alcohol use: Yes  Comment: rare occ " 3-4 times a year"    Drug use: No    Home Medications Prior to Admission medications   Medication Sig Start Date End Date Taking? Authorizing Provider  oxyCODONE-acetaminophen (PERCOCET/ROXICET) 5-325 MG tablet Take 1-2 tablets by mouth every 6 (six) hours as needed for up to 5 days for severe pain. 10/19/20 10/24/20 Yes Regan Lemming, MD  amLODipine (NORVASC) 2.5 MG tablet Take 2.5 mg by mouth daily.    [provider]  Ascorbic Acid (VITAMIN C) 100 MG tablet Take 100 mg by mouth daily.    [provider]  aspirin 81 MG tablet Take 81 mg by mouth every evening.     [provider]  celecoxib (CELEBREX) 200 MG capsule Take 200 mg by mouth daily as needed for mild pain.    [provider]  Cholecalciferol (D 10000) 10000 units CAPS Take by mouth.    [provider]  ezetimibe (ZETIA) 10 MG tablet Take 10 mg by mouth at bedtime.    [provider]  gabapentin (NEURONTIN) 100  MG capsule Take 100 mg by mouth 3 (three) times daily.    [provider]  ibuprofen (ADVIL,MOTRIN) 200 MG tablet Take 100-200 mg by mouth every 6 (six) hours as needed for moderate pain.     [provider]  levothyroxine (SYNTHROID, LEVOTHROID) 112 MCG tablet Take 112 mcg by mouth daily before breakfast. No Substitutions " brand name Synthroid only"    [provider]  telmisartan-hydrochlorothiazide (MICARDIS HCT) 80-12.5 MG tablet Take 1 tablet by mouth daily.    [provider]  zinc gluconate 50 MG tablet Take 50 mg by mouth daily.    [provider]    Allergies    Altace [ramipril] and Tussicaps [hydrocod polst-cpm polst er]  Review of Systems   Review of Systems  Constitutional:  Negative for chills and fever.  HENT:  Negative for ear pain and sore throat.   Eyes:  Negative for pain and visual disturbance.  Respiratory:  Negative for cough and shortness of breath.   Cardiovascular:  Negative for chest pain and palpitations.  Gastrointestinal:  Negative for abdominal pain and vomiting.  Genitourinary:  Negative for dysuria and hematuria.  Musculoskeletal:  Positive for arthralgias. Negative for back pain.  Skin:  Negative for color change and rash.  Neurological:  Negative for seizures, syncope and headaches.  All other systems reviewed and are negative.  Physical Exam Updated Vital Signs BP 135/64   Pulse 81   Temp 97.6 F (36.4 C) (Oral)   Resp 13   Wt 128.7 kg   SpO2 97%   BMI 49.65 kg/m   Physical Exam Vitals and nursing note reviewed.  Constitutional:      General: She is not in acute distress.    Appearance: She is well-developed.     Comments: GCS 15, ABC intact  HENT:     Head: Normocephalic and atraumatic.  Eyes:     Conjunctiva/sclera: Conjunctivae normal.  Neck:     Comments: No midline tenderness palpation of the cervical spine, range of motion intact Cardiovascular:     Rate and Rhythm: Normal rate and  regular rhythm.     Heart sounds: No murmur heard. Pulmonary:     Effort: Pulmonary effort is normal. No respiratory distress.     Breath sounds: Normal breath sounds.  Chest:     Comments: Chest wall nontender to AP and lateral compression, clavicles stable and nontender Abdominal:  Palpations: Abdomen is soft.     Tenderness: There is no abdominal tenderness.  Musculoskeletal:        General: Tenderness, deformity and signs of injury present.     Cervical back: Neck supple.     Comments: Tenderness, swelling and deformity of the left proximal humerus, decreased ROM due to the pain, left upper extremity neurovascular intact with intact motor function and sensation along the distribution of the median, ulnar and radial nerves.  2+ radial pulses.  Skin:    General: Skin is warm and dry.  Neurological:     Mental Status: She is alert.     Cranial Nerves: Cranial nerves are intact.     Sensory: Sensation is intact.     Motor: Motor function is intact.    ED Results / Procedures / Treatments   Labs (all labs ordered are listed, but only abnormal results are displayed) Labs Reviewed - No data to display  EKG None  Radiology CT Shoulder Left Wo Contrast  Result Date: 10/19/2020 CLINICAL DATA:  75 year old female with trauma to the left shoulder. EXAM: CT OF THE UPPER LEFT EXTREMITY WITHOUT CONTRAST TECHNIQUE: Multidetector CT imaging of the upper left extremity was performed according to the standard protocol. COMPARISON:  Left shoulder radiograph dated 10/19/2020. FINDINGS: Bones/Joint/Cartilage There is a comminuted fracture of the left humeral head and neck. There is a transverse fracture component through the humeral neck. Mildly displaced fracture fragment of the greater tubercle of the humerus. Minimal irregularity of the inferior bony glenoid likely chronic changes. A nondisplaced cortical fracture is less likely. The bones are osteopenic. There is no dislocation. There is a  small joint effusion. Ligaments Suboptimally assessed by CT. Muscles and Tendons There is edema in the musculature of the left shoulder. No fluid collection. Soft tissues There is emphysematous changes of the lungs. There is coronary vascular calcification. IMPRESSION: Comminuted fracture of the left humeral head and neck. No dislocation. Electronically Signed   By: Anner Crete M.D.   On: 10/19/2020 23:17   DG Shoulder Left  Result Date: 10/19/2020 CLINICAL DATA:  Status post fall. EXAM: LEFT SHOULDER - 2+ VIEW COMPARISON:  None. FINDINGS: There is an acute fracture of the head and surgical neck of the proximal left humerus. Anterior dislocation of the left humeral head is also seen. Lateral soft tissue swelling is noted. IMPRESSION: Acute fracture of the proximal left humerus with associated anterior dislocation of the left shoulder. Electronically Signed   By: Virgina Norfolk M.D.   On: 10/19/2020 21:15   DG Humerus Left  Result Date: 10/19/2020 CLINICAL DATA:  Status post fall. EXAM: LEFT HUMERUS - 2+ VIEW COMPARISON:  None. FINDINGS: An acute fracture is seen involving the head and neck of the proximal left humerus. Anterior dislocation of the left humeral head is suspected. Soft tissues are unremarkable. IMPRESSION: Acute fracture of the proximal left humerus with suspected anterior dislocation of the left shoulder. Electronically Signed   By: Virgina Norfolk M.D.   On: 10/19/2020 21:12    Procedures Procedures   Medications Ordered in ED Medications  fentaNYL (SUBLIMAZE) injection 50 mcg (50 mcg Intravenous Given 10/19/20 2225)  ondansetron (ZOFRAN) injection 4 mg (4 mg Intravenous Given 10/19/20 2226)  oxyCODONE-acetaminophen (PERCOCET/ROXICET) 5-325 MG per tablet 2 tablet (2 tablets Oral Given 10/19/20 2309)  HYDROmorphone (DILAUDID) injection 1 mg (1 mg Intravenous Given 10/20/20 0011)  ondansetron (ZOFRAN-ODT) disintegrating tablet 4 mg (4 mg Oral Given 10/20/20 0032)    ED Course  I have reviewed the triage vital signs and the nursing notes.  Pertinent labs & imaging results that were available during my care of the patient were reviewed by me and considered in my medical decision making (see chart for details).    MDM Rules/Calculators/A&P                           75 year old female presenting after a mechanical ground-level fall with left shoulder pain and swelling.  No head trauma or loss of consciousness.  The patient is not on anticoagulation.  No other injuries identified on primary or secondary survey other than pain about the left shoulder.  X-ray imaging was concerning for a proximal humerus fracture and possible anterior shoulder dislocation.  The patient specifically requested [facility] as she had been a patient of [facility] in the past.  The on-call service was contacted for [facility].  Recommendations included CT of the extremity, placement of the patient in a sling and pain control.  Plan for follow-up with orthopedics tomorrow in clinic.  CT of the left shoulder reveals that the left shoulder was located, comminuted fracture of the left proximal humeral head and neck.  A sling was ordered and was pending at time of signout.  The patient was provided oral Percocet for pain control in addition to IV Dilaudid.  Plan at time of signout to discharge the patient home pending sling placement.  The patient knows to follow-up with [facility] urgently tomorrow.  Final Clinical Impression(s) / ED Diagnoses Final diagnoses:  Other closed displaced fracture of proximal end of left humerus, initial encounter  Fall, initial encounter    Rx / DC Orders ED Discharge Orders          Ordered    oxyCODONE-acetaminophen (PERCOCET/ROXICET) 5-325 MG tablet  Every 6 hours PRN        10/19/20 2342             Regan Lemming, MD 10/20/20 1357

## 2020-10-20 MED ORDER — ONDANSETRON 4 MG PO TBDP
4.0000 mg | ORAL_TABLET | Freq: Once | ORAL | Status: AC
  Administered 2020-10-20: 4 mg via ORAL
  Filled 2020-10-20: qty 1

## 2020-10-25 DIAGNOSIS — S42202A Unspecified fracture of upper end of left humerus, initial encounter for closed fracture: Secondary | ICD-10-CM | POA: Diagnosis not present

## 2020-10-25 NOTE — Progress Notes (Signed)
Please place orders for PAT appointment scheduled 10/27/20.

## 2020-10-25 NOTE — Patient Instructions (Addendum)
DUE TO COVID-19 ONLY ONE VISITOR IS ALLOWED TO COME WITH YOU AND STAY IN THE WAITING ROOM ONLY DURING PRE OP AND PROCEDURE.   **NO VISITORS ARE ALLOWED IN THE SHORT STAY AREA OR RECOVERY ROOM!!**  IF YOU WILL BE ADMITTED INTO THE HOSPITAL YOU ARE ALLOWED ONLY TWO SUPPORT PEOPLE DURING VISITATION HOURS ONLY (10AM -8PM)   The support person(s) may change daily. The support person(s) must pass our screening, gel in and out, and wear a mask at all times, including in the patient's room. Patients must also wear a mask when staff or their support person are in the room.  No visitors under the age of 60. Any visitor under the age of 29 must be accompanied by an adult.    COVID SWAB TESTING MUST BE COMPLETED ON:  10/27/20 **MUST PRESENT COMPLETED FORM AT TESTING SITE**    [facility] [place] (backside of the building) Open 8am-3pm. No appointment needed. You are not required to quarantine, however you are required to wear a well-fitted mask when you are out and around people not in your household.  Hand Hygiene often Do NOT share personal items Notify your provider if you are in close contact with someone who has COVID or you develop fever 100.4 or greater, new onset of sneezing, cough, sore throat, shortness of breath or body aches.   Your procedure is scheduled on: 10/28/20   Report to [facility] Main  Entrance    Report to admitting at 2:00 PM   Call this number if you have problems the morning of surgery [phone]   Do not eat food :After Midnight.   May have liquids until  1:00 PM  day of surgery  CLEAR LIQUID DIET  Foods Allowed                                                                     Foods Excluded  Water, Black Coffee and tea, regular and decaf               liquids that you cannot  Plain Jell-O in any flavor  (No red)                                    see through such as: Fruit ices (not with fruit pulp)                                             milk, soups, orange juice              Iced Popsicles (No red)                                                All solid food  Apple juices Sports drinks like Gatorade (No red) Lightly seasoned clear broth or consume(fat free) Sugar, honey syrup     Oral Hygiene is also important to reduce your risk of infection.                                    Remember - BRUSH YOUR TEETH THE MORNING OF SURGERY WITH YOUR REGULAR TOOTHPASTE   Take these medicines the morning of surgery with A SIP OF WATER: Amlodipine, Zetia, Gabapentin, Synthroid.                               You may not have any metal on your body including hair pins, jewelry, and body piercing             Do not wear make-up, lotions, powders, perfumes, or deodorant  Do not wear nail polish including gel and S&S, artificial/acrylic nails, or any other type of covering on natural nails including finger and toenails. If you have artificial nails, gel coating, etc. that needs to be removed by a nail salon please have this removed prior to surgery or surgery may need to be canceled/ delayed if the surgeon/ anesthesia feels like they are unable to be safely monitored.   Do not shave  48 hours prior to surgery.           Do not bring valuables to the hospital. [place].   Contacts, dentures or bridgework may not be worn into surgery.   Bring small overnight bag day of surgery.   Special Instructions: Bring a copy of your healthcare power of attorney and living will documents         the day of surgery if you haven't scanned them in before.   Please read over the following fact sheets you were given: IF YOU HAVE QUESTIONS ABOUT YOUR PRE OP INSTRUCTIONS PLEASE CALL [phone]- [place] - Preparing for Surgery Before surgery, you can play an important role.  Because skin is not sterile, your skin needs to be as free of germs as possible.   You can reduce the number of germs on your skin by washing with CHG (chlorahexidine gluconate) soap before surgery.  CHG is an antiseptic cleaner which kills germs and bonds with the skin to continue killing germs even after washing. Please DO NOT use if you have an allergy to CHG or antibacterial soaps.  If your skin becomes reddened/irritated stop using the CHG and inform your nurse when you arrive at Short Stay. Do not shave (including legs and underarms) for at least 48 hours prior to the first CHG shower.  You may shave your face/neck.  Please follow these instructions carefully:  1.  Shower with CHG Soap the night before surgery and the  morning of surgery.  2.  If you choose to wash your hair, wash your hair first as usual with your normal  shampoo.  3.  After you shampoo, rinse your hair and body thoroughly to remove the shampoo.                             4.  Use CHG as you would any other liquid soap.  You can apply chg directly to the skin and wash.  Gently with a scrungie or clean washcloth.  5.  Apply the CHG Soap to your body ONLY FROM THE NECK DOWN.   Do   not use on face/ open                           Wound or open sores. Avoid contact with eyes, ears mouth and   genitals (private parts).                       Wash face,  Genitals (private parts) with your normal soap.             6.  Wash thoroughly, paying special attention to the area where your    surgery  will be performed.  7.  Thoroughly rinse your body with warm water from the neck down.  8.  DO NOT shower/wash with your normal soap after using and rinsing off the CHG Soap.                9.  Pat yourself dry with a clean towel.            10.  Wear clean pajamas.            11.  Place clean sheets on your bed the night of your first shower and do not  sleep with pets. Day of Surgery : Do not apply any lotions/deodorants the morning of surgery.  Please wear clean clothes to the hospital/surgery center.  FAILURE TO  FOLLOW THESE INSTRUCTIONS MAY RESULT IN THE CANCELLATION OF YOUR SURGERY  PATIENT SIGNATURE_________________________________  NURSE SIGNATURE__________________________________  ________________________________________________________________________

## 2020-10-26 ENCOUNTER — Other Ambulatory Visit (HOSPITAL_COMMUNITY): Payer: Medicare Other

## 2020-10-27 ENCOUNTER — Other Ambulatory Visit: Payer: Self-pay

## 2020-10-27 ENCOUNTER — Encounter (HOSPITAL_COMMUNITY): Payer: Self-pay

## 2020-10-27 ENCOUNTER — Encounter (HOSPITAL_COMMUNITY)
Admission: RE | Admit: 2020-10-27 | Discharge: 2020-10-27 | Disposition: A | Discharge status: Home / Self Care | Payer: Medicare Other | Source: Ambulatory Visit | Attending: Orthopedic Surgery | Admitting: Orthopedic Surgery

## 2020-10-27 ENCOUNTER — Other Ambulatory Visit: Payer: Self-pay | Admitting: Orthopedic Surgery

## 2020-10-27 DIAGNOSIS — Z01818 Encounter for other preprocedural examination: Secondary | ICD-10-CM | POA: Insufficient documentation

## 2020-10-27 HISTORY — DX: Malignant (primary) neoplasm, unspecified: C80.1

## 2020-10-27 LAB — BASIC METABOLIC PANEL
Anion gap: 9 (ref 5–15)
BUN: 23 mg/dL (ref 8–23)
CO2: 24 mmol/L (ref 22–32)
Calcium: 9.7 mg/dL (ref 8.9–10.3)
Chloride: 106 mmol/L (ref 98–111)
Creatinine, Ser: 1.18 mg/dL — ABNORMAL HIGH (ref 0.44–1.00)
GFR, Estimated: 48 mL/min — ABNORMAL LOW (ref >60)
Glucose, Bld: 101 mg/dL — ABNORMAL HIGH (ref 70–99)
Potassium: 4.1 mmol/L (ref 3.5–5.1)
Sodium: 139 mmol/L (ref 135–145)

## 2020-10-27 LAB — CBC
HCT: 41.3 % (ref 36.0–46.0)
Hemoglobin: 13.1 g/dL (ref 12.0–15.0)
MCH: 30.7 pg (ref 26.0–34.0)
MCHC: 31.7 g/dL (ref 30.0–36.0)
MCV: 96.7 fL (ref 80.0–100.0)
Platelets: 292 10*3/uL (ref 150–400)
RBC: 4.27 MIL/uL (ref 3.87–5.11)
RDW: 14.7 % (ref 11.5–15.5)
WBC: 8.7 10*3/uL (ref 4.0–10.5)
nRBC: 0 % (ref 0.0–0.2)

## 2020-10-27 LAB — SARS CORONAVIRUS 2 (TAT 6-24 HRS): SARS Coronavirus 2: NEGATIVE

## 2020-10-27 NOTE — H&P (Signed)
Patient's anticipated LOS is less than 2 midnights, meeting these requirements: - Younger than 68 - Lives within 1 hour of care - Has a competent adult at home to recover with post-op recover - NO history of  - Chronic pain requiring opiods  - Diabetes  - Coronary Artery Disease  - Heart failure  - Heart attack  - Stroke  - DVT/VTE  - Cardiac arrhythmia  - Respiratory Failure/COPD  - Renal failure  - Anemia  - Advanced Liver disease     [place]erm[place] P Grundstrom is an 75 y.o. female.    Chief Complaint: left shoulder pain  HPI: Pt is a 75 y.o. female complaining of left shoulder pain after recent fall. Pain had continually increased since the beginning. X-rays in the clinic show displaced left proximal humerus fracture. Pt has tried various conservative treatments which have failed to alleviate their symptoms. Various options are discussed with the patient. Risks, benefits and expectations were discussed with the patient. Patient understand the risks, benefits and expectations and wishes to proceed with surgery.   PCP:  [facility] [place]  D/C Plans: Home  PMH: Past Medical History:  Diagnosis Date   Cataract    not surgical yet   Fractured pelvis ([facility])    GERD (gastroesophageal reflux disease)    Headache(784.0)    migraines "cluster headaches"-most days   High cholesterol    Hypertension    Hypothyroidism    PONV (postoperative nausea and vomiting)    Thyroid disease     PSH: Past Surgical History:  Procedure Laterality Date   CATARACT EXTRACTION Right    EYE SURGERY Right 04/01/2019   Vitrectomy w/ ILM   LAPAROSCOPIC CHOLECYSTECTOMY  2000?   Left knee surg  2009   "Torn meniscus"    ORIF ULNAR FRACTURE Right 12/28/2013   Procedure: OPEN REDUCTION INTERNAL FIXATION (ORIF) ULNAR AND RADIAL FRACTURE;  Surgeon: Charlotte Crumb, MD;  Location: [facility];  Service: Orthopedics;  Laterality: Right;   ROBOTIC ASSISTED TOTAL HYSTERECTOMY WITH BILATERAL SALPINGO  OOPHERECTOMY Bilateral 01/20/2013   Procedure: ROBOTIC ASSISTED TOTAL ABDOMINAL HYSTERECTOMY WITH BILATERAL SALPINGO OOPHORECTOMY PELVIC LYMPH NODE DISSECTION;  Surgeon: Imagene Gurney A. Alycia Rossetti, MD;  Location: [facility];  Service: Gynecology;  Laterality: Bilateral;   TONSILECTOMY, ADENOIDECTOMY, BILATERAL MYRINGOTOMY AND TUBES  1955    Social History:  reports that she quit smoking about 32 years ago. She started smoking about 46 years ago. She has never used smokeless tobacco. She reports current alcohol use. She reports that she does not use drugs.  Allergies:  Allergies  Allergen Reactions   Altace [Ramipril] Hives   Amoxicillin Rash   Tussicaps [Hydrocod Polst-Cpm Polst Er] Rash    Medications: No current facility-administered medications for this encounter.   Current Outpatient Medications  Medication Sig Dispense Refill   acetaminophen (TYLENOL) 650 MG CR tablet Take 650 mg by mouth every 8 (eight) hours as needed for pain.     amLODipine (NORVASC) 2.5 MG tablet Take 2.5 mg by mouth daily.     Ascorbic Acid (VITAMIN C) 1000 MG tablet Take 1,000 mg by mouth at bedtime.     aspirin 81 MG tablet Take 81 mg by mouth at bedtime.     celecoxib (CELEBREX) 200 MG capsule Take 200 mg by mouth daily.     Cholecalciferol 250 MCG (10000 UT) CAPS Take 10,000 Units by mouth 3 (three) times a week. Sun, Wed, and Fri     ezetimibe (ZETIA) 10 MG tablet Take 10 mg by  mouth at bedtime.     gabapentin (NEURONTIN) 100 MG capsule Take 100 mg by mouth daily as needed (pain).     ibuprofen (ADVIL,MOTRIN) 200 MG tablet Take 400 mg by mouth every 6 (six) hours as needed for moderate pain or headache.     levothyroxine (SYNTHROID, LEVOTHROID) 112 MCG tablet Take 112 mcg by mouth daily before breakfast. No Substitutions " brand name Synthroid only"     Menthol, Topical Analgesic, (BLUE-EMU MAXIMUM STRENGTH EX) Apply 1 application topically daily as needed (knee pain).     ondansetron (ZOFRAN-ODT) 4 MG disintegrating  tablet Take 4 mg by mouth every 6 (six) hours as needed for nausea/vomiting.     Propylene Glycol (SYSTANE COMPLETE) 0.6 % SOLN Place 1 drop into both eyes daily as needed (dry eyes).     telmisartan-hydrochlorothiazide (MICARDIS HCT) 80-25 MG tablet Take 0.5 tablets by mouth 2 (two) times daily.     Trolamine Salicylate (ASPERCREME EX) Apply 1 application topically daily as needed (knee pain).     zinc gluconate 50 MG tablet Take 50 mg by mouth at bedtime.      No results found for this or any previous visit (from the past 48 hour(s)). No results found.  ROS: Pain with rom of the left upper extremity  Physical Exam: Alert and oriented 75 y.o. female in no acute distress Cranial nerves 2-12 intact Cervical spine: full rom with no tenderness, nv intact distally Chest: active breath sounds bilaterally, no wheeze rhonchi or rales Heart: regular rate and rhythm, no murmur Abd: non tender non distended with active bowel sounds Hip is stable with rom  Left shoulder with painful rom with edema Nv intact distally No rashes or edema distally   Assessment/Plan Assessment: left displaced proximal humerus fracture  Plan:  Patient will undergo a left proximal humerus ORIF by Dr. Veverly Fells at [place] Risks benefits and expectations were discussed with the patient. Patient understand risks, benefits and expectations and wishes to proceed. Preoperative templating of the joint replacement has been completed, documented, and submitted to the Operating Room personnel in order to optimize intra-operative equipment management.   Merla Riches PA-C, MPAS [facility] is now [employer] [facility] [address] [place], [place], [place] [address] Phone: [phone] [email or web address]  [employer]

## 2020-10-27 NOTE — Progress Notes (Addendum)
COVID Vaccine Completed: No Date COVID Vaccine completed: Has received booster: COVID vaccine manufacturer: [employer]   Date of COVID positive in last 90 days: No  PCP - Jani Gravel (on leave), Janie Morning while Dr Maudie Mercury is on leave Cardiologist - N/a  Chest x-ray - N/a EKG - need day of surgery Stress Test - years ago ECHO - [facility], on chart  Cardiac Cath - N/a Pacemaker/ICD device last checked: N/a Spinal Cord Stimulator: N/a  Sleep Study -  N/a CPAP -   Fasting Blood Sugar - N/a Checks Blood Sugar _____ times a day   Aspirin Instructions: '81mg'$  no instructions given per pt   Activity level: Can perform activities of daily living without stopping and without symptoms of chest pain or shortness of breath. Slow to get up stairs, needs help per patient.      Anesthesia review: HTN,   Patient denies shortness of breath, fever, cough and chest pain at PAT appointment   Patient verbalized understanding of instructions that were given to them at the PAT appointment. Patient was also instructed that they will need to review over the PAT instructions again at home before surgery.

## 2020-10-28 ENCOUNTER — Observation Stay (HOSPITAL_COMMUNITY)
Admission: RE | Admit: 2020-10-28 | Discharge: 2020-10-29 | Disposition: A | Discharge status: Home / Self Care | Payer: Medicare Other | Attending: Orthopedic Surgery | Admitting: Orthopedic Surgery

## 2020-10-28 ENCOUNTER — Other Ambulatory Visit: Payer: Self-pay

## 2020-10-28 ENCOUNTER — Inpatient Hospital Stay (HOSPITAL_COMMUNITY): Payer: Medicare Other | Admitting: Physician Assistant

## 2020-10-28 ENCOUNTER — Encounter (HOSPITAL_COMMUNITY): Payer: Self-pay | Admitting: Orthopedic Surgery

## 2020-10-28 ENCOUNTER — Encounter (HOSPITAL_COMMUNITY)
Admission: RE | Disposition: A | Discharge status: Home / Self Care | Payer: Self-pay | Source: Home / Self Care | Attending: Orthopedic Surgery

## 2020-10-28 ENCOUNTER — Inpatient Hospital Stay (HOSPITAL_COMMUNITY): Payer: Medicare Other | Admitting: Anesthesiology

## 2020-10-28 DIAGNOSIS — S42309A Unspecified fracture of shaft of humerus, unspecified arm, initial encounter for closed fracture: Secondary | ICD-10-CM | POA: Diagnosis present

## 2020-10-28 DIAGNOSIS — Z79899 Other long term (current) drug therapy: Secondary | ICD-10-CM | POA: Diagnosis not present

## 2020-10-28 DIAGNOSIS — W1830XA Fall on same level, unspecified, initial encounter: Secondary | ICD-10-CM | POA: Diagnosis not present

## 2020-10-28 DIAGNOSIS — S42292A Other displaced fracture of upper end of left humerus, initial encounter for closed fracture: Principal | ICD-10-CM | POA: Insufficient documentation

## 2020-10-28 DIAGNOSIS — G8918 Other acute postprocedural pain: Secondary | ICD-10-CM | POA: Diagnosis not present

## 2020-10-28 DIAGNOSIS — Z87891 Personal history of nicotine dependence: Secondary | ICD-10-CM | POA: Insufficient documentation

## 2020-10-28 DIAGNOSIS — I10 Essential (primary) hypertension: Secondary | ICD-10-CM | POA: Insufficient documentation

## 2020-10-28 DIAGNOSIS — Z8542 Personal history of malignant neoplasm of other parts of uterus: Secondary | ICD-10-CM | POA: Diagnosis not present

## 2020-10-28 DIAGNOSIS — M6281 Muscle weakness (generalized): Secondary | ICD-10-CM | POA: Diagnosis not present

## 2020-10-28 DIAGNOSIS — S42202A Unspecified fracture of upper end of left humerus, initial encounter for closed fracture: Secondary | ICD-10-CM | POA: Diagnosis not present

## 2020-10-28 DIAGNOSIS — E039 Hypothyroidism, unspecified: Secondary | ICD-10-CM | POA: Insufficient documentation

## 2020-10-28 DIAGNOSIS — Z7982 Long term (current) use of aspirin: Secondary | ICD-10-CM | POA: Insufficient documentation

## 2020-10-28 DIAGNOSIS — S42222A 2-part displaced fracture of surgical neck of left humerus, initial encounter for closed fracture: Secondary | ICD-10-CM | POA: Diagnosis not present

## 2020-10-28 HISTORY — PX: ORIF HUMERUS FRACTURE: SHX2126

## 2020-10-28 SURGERY — OPEN REDUCTION INTERNAL FIXATION (ORIF) PROXIMAL HUMERUS FRACTURE
Anesthesia: Regional | Laterality: Left

## 2020-10-28 MED ORDER — METHOCARBAMOL 500 MG IVPB - SIMPLE MED
500.0000 mg | Freq: Four times a day (QID) | INTRAVENOUS | Status: DC | PRN
  Filled 2020-10-28: qty 50

## 2020-10-28 MED ORDER — AMLODIPINE BESYLATE 5 MG PO TABS
2.5000 mg | ORAL_TABLET | Freq: Every day | ORAL | Status: DC
  Administered 2020-10-29: 2.5 mg via ORAL
  Filled 2020-10-28: qty 1

## 2020-10-28 MED ORDER — CHLORHEXIDINE GLUCONATE 0.12 % MT SOLN
15.0000 mL | Freq: Once | OROMUCOSAL | Status: AC
  Administered 2020-10-28: 15 mL via OROMUCOSAL

## 2020-10-28 MED ORDER — MORPHINE SULFATE (PF) 2 MG/ML IV SOLN: 0.5000 mg | INTRAVENOUS | Status: DC | PRN

## 2020-10-28 MED ORDER — PHENYLEPHRINE HCL-NACL 20-0.9 MG/250ML-% IV SOLN
INTRAVENOUS | Status: DC | PRN
  Administered 2020-10-28: 50 ug/min via INTRAVENOUS

## 2020-10-28 MED ORDER — ROCURONIUM BROMIDE 10 MG/ML (PF) SYRINGE
PREFILLED_SYRINGE | INTRAVENOUS | Status: DC | PRN
  Administered 2020-10-28: 60 mg via INTRAVENOUS

## 2020-10-28 MED ORDER — LEVOTHYROXINE SODIUM 112 MCG PO TABS
112.0000 ug | ORAL_TABLET | Freq: Every day | ORAL | Status: DC
  Administered 2020-10-29: 112 ug via ORAL
  Filled 2020-10-28: qty 1

## 2020-10-28 MED ORDER — ZINC GLUCONATE 50 MG PO TABS: 50.0000 mg | ORAL_TABLET | Freq: Every day | ORAL | Status: DC

## 2020-10-28 MED ORDER — POLYETHYLENE GLYCOL 3350 17 G PO PACK: 17.0000 g | PACK | Freq: Every day | ORAL | Status: DC | PRN

## 2020-10-28 MED ORDER — LACTATED RINGERS IV SOLN: INTRAVENOUS | Status: DC

## 2020-10-28 MED ORDER — ALBUMIN HUMAN 5 % IV SOLN
INTRAVENOUS | Status: DC | PRN
Start: 2020-10-28 — End: 2020-10-28

## 2020-10-28 MED ORDER — IRBESARTAN 150 MG PO TABS
150.0000 mg | ORAL_TABLET | Freq: Two times a day (BID) | ORAL | Status: DC
  Administered 2020-10-28 – 2020-10-29 (×2): 150 mg via ORAL
  Filled 2020-10-28 (×2): qty 1

## 2020-10-28 MED ORDER — CELECOXIB 200 MG PO CAPS
200.0000 mg | ORAL_CAPSULE | Freq: Every day | ORAL | Status: DC
  Administered 2020-10-28 – 2020-10-29 (×2): 200 mg via ORAL
  Filled 2020-10-28 (×2): qty 1

## 2020-10-28 MED ORDER — MIDAZOLAM HCL 2 MG/2ML IJ SOLN
INTRAMUSCULAR | Status: AC
  Filled 2020-10-28: qty 2

## 2020-10-28 MED ORDER — FENTANYL CITRATE (PF) 100 MCG/2ML IJ SOLN
100.0000 ug | Freq: Once | INTRAMUSCULAR | Status: AC
  Administered 2020-10-28: 100 ug via INTRAVENOUS
  Filled 2020-10-28: qty 2

## 2020-10-28 MED ORDER — PHENYLEPHRINE HCL (PRESSORS) 10 MG/ML IV SOLN
INTRAVENOUS | Status: DC | PRN
  Administered 2020-10-28: 160 ug via INTRAVENOUS
  Administered 2020-10-28 (×2): 80 ug via INTRAVENOUS

## 2020-10-28 MED ORDER — PROPOFOL 10 MG/ML IV BOLUS
INTRAVENOUS | Status: AC
  Filled 2020-10-28: qty 20

## 2020-10-28 MED ORDER — BUPIVACAINE-EPINEPHRINE (PF) 0.25% -1:200000 IJ SOLN
INTRAMUSCULAR | Status: AC
  Filled 2020-10-28: qty 30

## 2020-10-28 MED ORDER — HYDROCHLOROTHIAZIDE 12.5 MG PO CAPS
12.5000 mg | ORAL_CAPSULE | Freq: Two times a day (BID) | ORAL | Status: DC
  Administered 2020-10-28 – 2020-10-29 (×2): 12.5 mg via ORAL
  Filled 2020-10-28 (×2): qty 1

## 2020-10-28 MED ORDER — MENTHOL 3 MG MT LOZG: 1.0000 | LOZENGE | OROMUCOSAL | Status: DC | PRN

## 2020-10-28 MED ORDER — CEFAZOLIN IN SODIUM CHLORIDE 3-0.9 GM/100ML-% IV SOLN
3.0000 g | INTRAVENOUS | Status: AC
  Administered 2020-10-28: 3 g via INTRAVENOUS
  Filled 2020-10-28 (×2): qty 100

## 2020-10-28 MED ORDER — ONDANSETRON 4 MG PO TBDP: 4.0000 mg | ORAL_TABLET | Freq: Four times a day (QID) | ORAL | Status: DC | PRN

## 2020-10-28 MED ORDER — TRAMADOL HCL 50 MG PO TABS: 50.0000 mg | ORAL_TABLET | Freq: Four times a day (QID) | ORAL | 0 refills | Status: AC | PRN

## 2020-10-28 MED ORDER — FENTANYL CITRATE (PF) 100 MCG/2ML IJ SOLN
INTRAMUSCULAR | Status: AC
  Administered 2020-10-28: 25 ug via INTRAVENOUS
  Filled 2020-10-28: qty 2

## 2020-10-28 MED ORDER — PROMETHAZINE HCL 25 MG/ML IJ SOLN: 6.2500 mg | INTRAMUSCULAR | Status: DC | PRN

## 2020-10-28 MED ORDER — MIDAZOLAM HCL 2 MG/2ML IJ SOLN
2.0000 mg | Freq: Once | INTRAMUSCULAR | Status: DC
  Filled 2020-10-28: qty 2

## 2020-10-28 MED ORDER — PHENOL 1.4 % MT LIQD: 1.0000 | OROMUCOSAL | Status: DC | PRN

## 2020-10-28 MED ORDER — METOCLOPRAMIDE HCL 5 MG/ML IJ SOLN: 5.0000 mg | Freq: Three times a day (TID) | INTRAMUSCULAR | Status: DC | PRN

## 2020-10-28 MED ORDER — METHOCARBAMOL 500 MG PO TABS
500.0000 mg | ORAL_TABLET | Freq: Four times a day (QID) | ORAL | Status: DC | PRN
  Administered 2020-10-29: 500 mg via ORAL
  Filled 2020-10-28: qty 1

## 2020-10-28 MED ORDER — APREPITANT 40 MG PO CAPS
40.0000 mg | ORAL_CAPSULE | Freq: Once | ORAL | Status: AC
  Administered 2020-10-28: 40 mg via ORAL
  Filled 2020-10-28: qty 1

## 2020-10-28 MED ORDER — BUPIVACAINE-EPINEPHRINE 0.5% -1:200000 IJ SOLN
INTRAMUSCULAR | Status: DC | PRN
  Administered 2020-10-28: 16 mL

## 2020-10-28 MED ORDER — ONDANSETRON HCL 4 MG/2ML IJ SOLN
INTRAMUSCULAR | Status: DC | PRN
  Administered 2020-10-28: 4 mg via INTRAVENOUS

## 2020-10-28 MED ORDER — BUPIVACAINE HCL (PF) 0.5 % IJ SOLN
INTRAMUSCULAR | Status: DC | PRN
  Administered 2020-10-28: 15 mL via PERINEURAL

## 2020-10-28 MED ORDER — FENTANYL CITRATE (PF) 100 MCG/2ML IJ SOLN
25.0000 ug | INTRAMUSCULAR | Status: DC | PRN
  Administered 2020-10-28: 50 ug via INTRAVENOUS

## 2020-10-28 MED ORDER — FENTANYL CITRATE (PF) 250 MCG/5ML IJ SOLN
INTRAMUSCULAR | Status: AC
  Filled 2020-10-28: qty 5

## 2020-10-28 MED ORDER — AMISULPRIDE (ANTIEMETIC) 5 MG/2ML IV SOLN: 10.0000 mg | Freq: Once | INTRAVENOUS | Status: DC | PRN

## 2020-10-28 MED ORDER — ACETAMINOPHEN 500 MG PO TABS
1000.0000 mg | ORAL_TABLET | Freq: Once | ORAL | Status: AC
  Administered 2020-10-28: 1000 mg via ORAL
  Filled 2020-10-28: qty 2

## 2020-10-28 MED ORDER — OXYCODONE HCL 5 MG PO TABS: 5.0000 mg | ORAL_TABLET | Freq: Once | ORAL | Status: DC | PRN

## 2020-10-28 MED ORDER — VITAMIN D3 25 MCG (1000 UNIT) PO TABS: 10000.0000 [IU] | ORAL_TABLET | ORAL | Status: DC

## 2020-10-28 MED ORDER — CEFAZOLIN SODIUM-DEXTROSE 2-4 GM/100ML-% IV SOLN
2.0000 g | INTRAVENOUS | Status: DC
  Filled 2020-10-28 (×2): qty 100

## 2020-10-28 MED ORDER — TELMISARTAN-HCTZ 80-25 MG PO TABS: 0.5000 | ORAL_TABLET | Freq: Two times a day (BID) | ORAL | Status: DC

## 2020-10-28 MED ORDER — ACETAMINOPHEN 325 MG PO TABS: 325.0000 mg | ORAL_TABLET | Freq: Four times a day (QID) | ORAL | Status: DC | PRN

## 2020-10-28 MED ORDER — METOCLOPRAMIDE HCL 5 MG PO TABS: 5.0000 mg | ORAL_TABLET | Freq: Three times a day (TID) | ORAL | Status: DC | PRN

## 2020-10-28 MED ORDER — DEXAMETHASONE SODIUM PHOSPHATE 10 MG/ML IJ SOLN
INTRAMUSCULAR | Status: AC
  Filled 2020-10-28: qty 1

## 2020-10-28 MED ORDER — PROPOFOL 10 MG/ML IV BOLUS
INTRAVENOUS | Status: DC | PRN
  Administered 2020-10-28: 50 mg via INTRAVENOUS
  Administered 2020-10-28: 100 mg via INTRAVENOUS
  Administered 2020-10-28: 50 mg via INTRAVENOUS

## 2020-10-28 MED ORDER — MIDAZOLAM HCL 5 MG/5ML IJ SOLN
INTRAMUSCULAR | Status: DC | PRN
  Administered 2020-10-28: 1 mg via INTRAVENOUS
  Administered 2020-10-28: .5 mg via INTRAVENOUS

## 2020-10-28 MED ORDER — POLYVINYL ALCOHOL 1.4 % OP SOLN
1.0000 [drp] | Freq: Every day | OPHTHALMIC | Status: DC | PRN
  Filled 2020-10-28: qty 15

## 2020-10-28 MED ORDER — SODIUM CHLORIDE 0.9 % IV SOLN: INTRAVENOUS | Status: DC

## 2020-10-28 MED ORDER — ORAL CARE MOUTH RINSE: 15.0000 mL | Freq: Once | OROMUCOSAL | Status: AC

## 2020-10-28 MED ORDER — EZETIMIBE 10 MG PO TABS
10.0000 mg | ORAL_TABLET | Freq: Every day | ORAL | Status: DC
  Administered 2020-10-28: 10 mg via ORAL
  Filled 2020-10-28: qty 1

## 2020-10-28 MED ORDER — FENTANYL CITRATE (PF) 100 MCG/2ML IJ SOLN
INTRAMUSCULAR | Status: DC | PRN
  Administered 2020-10-28: 100 ug via INTRAVENOUS

## 2020-10-28 MED ORDER — ONDANSETRON HCL 4 MG PO TABS: 4.0000 mg | ORAL_TABLET | Freq: Four times a day (QID) | ORAL | Status: DC | PRN

## 2020-10-28 MED ORDER — DEXAMETHASONE SODIUM PHOSPHATE 10 MG/ML IJ SOLN
INTRAMUSCULAR | Status: DC | PRN
  Administered 2020-10-28: 10 mg via INTRAVENOUS

## 2020-10-28 MED ORDER — ACETAMINOPHEN ER 650 MG PO TBCR: 650.0000 mg | EXTENDED_RELEASE_TABLET | Freq: Three times a day (TID) | ORAL | Status: DC | PRN

## 2020-10-28 MED ORDER — ASCORBIC ACID 500 MG PO TABS
1000.0000 mg | ORAL_TABLET | Freq: Every day | ORAL | Status: DC
  Administered 2020-10-28: 1000 mg via ORAL
  Filled 2020-10-28: qty 2

## 2020-10-28 MED ORDER — ONDANSETRON HCL 4 MG/2ML IJ SOLN
INTRAMUSCULAR | Status: AC
  Filled 2020-10-28: qty 2

## 2020-10-28 MED ORDER — 0.9 % SODIUM CHLORIDE (POUR BTL) OPTIME
TOPICAL | Status: DC | PRN
  Administered 2020-10-28: 1000 mL

## 2020-10-28 MED ORDER — CEFAZOLIN SODIUM-DEXTROSE 2-4 GM/100ML-% IV SOLN
2.0000 g | Freq: Four times a day (QID) | INTRAVENOUS | Status: AC
  Administered 2020-10-28 – 2020-10-29 (×3): 2 g via INTRAVENOUS
  Filled 2020-10-28 (×3): qty 100

## 2020-10-28 MED ORDER — OXYCODONE HCL 5 MG/5ML PO SOLN: 5.0000 mg | Freq: Once | ORAL | Status: DC | PRN

## 2020-10-28 MED ORDER — GABAPENTIN 100 MG PO CAPS: 100.0000 mg | ORAL_CAPSULE | Freq: Every day | ORAL | Status: DC | PRN

## 2020-10-28 MED ORDER — SUGAMMADEX SODIUM 500 MG/5ML IV SOLN
INTRAVENOUS | Status: DC | PRN
  Administered 2020-10-28: 260 mg via INTRAVENOUS

## 2020-10-28 MED ORDER — ASPIRIN 81 MG PO CHEW
81.0000 mg | CHEWABLE_TABLET | Freq: Every day | ORAL | Status: DC
  Administered 2020-10-28: 81 mg via ORAL
  Filled 2020-10-28: qty 1

## 2020-10-28 MED ORDER — BISACODYL 10 MG RE SUPP: 10.0000 mg | Freq: Every day | RECTAL | Status: DC | PRN

## 2020-10-28 MED ORDER — BUPIVACAINE LIPOSOME 1.3 % IJ SUSP
INTRAMUSCULAR | Status: DC | PRN
  Administered 2020-10-28: 10 mL via PERINEURAL

## 2020-10-28 MED ORDER — ONDANSETRON HCL 4 MG/2ML IJ SOLN: 4.0000 mg | Freq: Four times a day (QID) | INTRAMUSCULAR | Status: DC | PRN

## 2020-10-28 MED ORDER — TRAMADOL HCL 50 MG PO TABS
50.0000 mg | ORAL_TABLET | Freq: Four times a day (QID) | ORAL | Status: DC | PRN
Start: 2020-10-28 — End: 2020-10-29
  Administered 2020-10-29: 50 mg via ORAL
  Filled 2020-10-28: qty 1

## 2020-10-28 MED ORDER — LIDOCAINE 2% (20 MG/ML) 5 ML SYRINGE
INTRAMUSCULAR | Status: DC | PRN
  Administered 2020-10-28 (×2): 60 mg via INTRAVENOUS

## 2020-10-28 MED ORDER — DOCUSATE SODIUM 100 MG PO CAPS
100.0000 mg | ORAL_CAPSULE | Freq: Two times a day (BID) | ORAL | Status: DC
  Administered 2020-10-28 – 2020-10-29 (×2): 100 mg via ORAL
  Filled 2020-10-28 (×2): qty 1

## 2020-10-28 SURGICAL SUPPLY — 64 items
BAG COUNTER SPONGE SURGICOUNT (BAG) IMPLANT
BAG ZIPLOCK 12X15 (MISCELLANEOUS) ×2 IMPLANT
BIT DRILL 3.2 (BIT) ×2
BIT DRILL 3.2XCALB NS DISP (BIT) ×1 IMPLANT
BIT DRILL CALIBRATED 2.7 (BIT) ×2 IMPLANT
BIT DRL 3.2XCALB NS DISP (BIT) ×1
BOOTIES KNEE HIGH SLOAN (MISCELLANEOUS) ×4 IMPLANT
COVER SURGICAL LIGHT HANDLE (MISCELLANEOUS) ×2 IMPLANT
DECANTER SPIKE VIAL GLASS SM (MISCELLANEOUS) ×2 IMPLANT
DRAPE POUCH INSTRU U-SHP 10X18 (DRAPES) ×2 IMPLANT
DRAPE SURG 17X11 SM STRL (DRAPES) ×2 IMPLANT
DRAPE U-SHAPE 47X51 STRL (DRAPES) ×2 IMPLANT
DRSG EMULSION OIL 3X16 NADH (GAUZE/BANDAGES/DRESSINGS) ×2 IMPLANT
DRSG EMULSION OIL 3X3 NADH (GAUZE/BANDAGES/DRESSINGS) IMPLANT
DRSG PAD ABDOMINAL 8X10 ST (GAUZE/BANDAGES/DRESSINGS) ×2 IMPLANT
DURAPREP 26ML APPLICATOR (WOUND CARE) ×2 IMPLANT
ELECT REM PT RETURN 15FT ADLT (MISCELLANEOUS) ×2 IMPLANT
GAUZE SPONGE 4X4 12PLY STRL (GAUZE/BANDAGES/DRESSINGS) ×2 IMPLANT
GLOVE SURG ORTHO LTX SZ7.5 (GLOVE) ×2 IMPLANT
GLOVE SURG ORTHO LTX SZ8.5 (GLOVE) ×2 IMPLANT
GLOVE SURG UNDER POLY LF SZ7.5 (GLOVE) ×2 IMPLANT
GLOVE SURG UNDER POLY LF SZ8.5 (GLOVE) ×2 IMPLANT
GOWN STRL REUS W/TWL LRG LVL3 (GOWN DISPOSABLE) ×4 IMPLANT
K-WIRE 2X5 SS THRDED S3 (WIRE) ×2
KIT BASIN OR (CUSTOM PROCEDURE TRAY) ×2 IMPLANT
KIT TURNOVER KIT A (KITS) ×2 IMPLANT
KWIRE 2X5 SS THRDED S3 (WIRE) ×1 IMPLANT
MANIFOLD NEPTUNE II (INSTRUMENTS) ×2 IMPLANT
NDL SAFETY ECLIPSE 18X1.5 (NEEDLE) ×1 IMPLANT
NEEDLE HYPO 18GX1.5 SHARP (NEEDLE) ×2
NEEDLE MA TROC 1/2 CIR (NEEDLE) ×2 IMPLANT
NEEDLE MAYO CATGUT SZ4 (NEEDLE) ×2 IMPLANT
NS IRRIG 1000ML POUR BTL (IV SOLUTION) ×2 IMPLANT
PACK SHOULDER (CUSTOM PROCEDURE TRAY) ×2 IMPLANT
PASSER SUT SWANSON 36MM LOOP (INSTRUMENTS) ×2 IMPLANT
PEG LOCKING 3.2X32 (Peg) ×2 IMPLANT
PEG LOCKING 3.2X34 (Screw) ×2 IMPLANT
PEG LOCKING 3.2X36 (Screw) ×4 IMPLANT
PEG LOCKING 3.2X38 (Screw) ×2 IMPLANT
PEG LOCKING 3.2X48 (Peg) ×2 IMPLANT
PENCIL SMOKE EVACUATOR (MISCELLANEOUS) IMPLANT
PLATE HUMERUS LP PROX L 3H (Plate) ×2 IMPLANT
PROTECTOR NERVE ULNAR (MISCELLANEOUS) ×2 IMPLANT
SCREW LOCK CANC STAR 4X26 (Screw) ×2 IMPLANT
SCREW LOCK CANC STAR 4X28 (Screw) ×2 IMPLANT
SCREW LOCK CANC STAR 4X30 (Screw) ×2 IMPLANT
SCREW LOW PROF TIS 3.5X28MM (Screw) ×2 IMPLANT
SLEEVE MEASURING 3.2 (BIT) ×2 IMPLANT
SLING ARM FOAM STRAP XLG (SOFTGOODS) ×2 IMPLANT
SLING ARM IMMOBILIZER LRG (SOFTGOODS) ×2 IMPLANT
SPONGE SURGIFOAM ABS GEL 100 (HEMOSTASIS) ×2 IMPLANT
SPONGE T-LAP 4X18 ~~LOC~~+RFID (SPONGE) IMPLANT
STAPLER VISISTAT 35W (STAPLE) ×2 IMPLANT
STRIP CLOSURE SKIN 1/2X4 (GAUZE/BANDAGES/DRESSINGS) IMPLANT
SUT BONE WAX W31G (SUTURE) ×2 IMPLANT
SUT VIC AB 0 CT1 27 (SUTURE) ×4
SUT VIC AB 0 CT1 27XBRD ANTBC (SUTURE) ×2 IMPLANT
SUT VIC AB 0 CT1 36 (SUTURE) ×2 IMPLANT
SUT VIC AB 2-0 CT1 27 (SUTURE) ×4
SUT VIC AB 2-0 CT1 27XBRD (SUTURE) ×1 IMPLANT
SUT VIC AB 2-0 CT1 TAPERPNT 27 (SUTURE) ×1 IMPLANT
SYR 50ML LL SCALE MARK (SYRINGE) ×2 IMPLANT
TOWEL OR 17X26 10 PK STRL BLUE (TOWEL DISPOSABLE) ×4 IMPLANT
WATER STERILE IRR 1000ML POUR (IV SOLUTION) ×2 IMPLANT

## 2020-10-28 NOTE — Anesthesia Procedure Notes (Addendum)
Anesthesia Regional Block: Interscalene brachial plexus block   Pre-Anesthetic Checklist: , timeout performed,  Correct Patient, Correct Site, Correct Laterality,  Correct Procedure, Correct Position, site marked,  Risks and benefits discussed,  Surgical consent,  Pre-op evaluation,  At surgeon's request and post-op pain management  Laterality: Left  Prep: Maximum Sterile Barrier Precautions used, chloraprep       Needles:  Injection technique: Single-shot  Needle Type: Echogenic Stimulator Needle     Needle Length: 9cm  Needle Gauge: 22     Additional Needles:   Procedures:,,,, ultrasound used (permanent image in chart),,    Narrative:  Start time: 10/28/2020 3:35 PM End time: 10/28/2020 3:45 PM Injection made incrementally with aspirations every 5 mL.  Performed by: Personally  Anesthesiologist: Pervis Hocking, DO  Additional Notes: Monitors applied. No increased pain on injection. No increased resistance to injection. Injection made in 5cc increments. Good needle visualization. Patient tolerated procedure well.

## 2020-10-28 NOTE — Brief Op Note (Signed)
10/28/2020  5:48 PM  PATIENT:  Shannon Gardner  75 y.o. female  PRE-OPERATIVE DIAGNOSIS:  left proximal humerus fracture, displaced  POST-OPERATIVE DIAGNOSIS:  left proximal humerus fracture, displaced  PROCEDURE:  Procedure(s) with comments: OPEN REDUCTION INTERNAL FIXATION (ORIF) PROXIMAL HUMERUS FRACTURE (Left) - with ISB Biomet ALPS plate  SURGEON:  Surgeon(s) and Role:    Netta Cedars, MD - Primary  PHYSICIAN ASSISTANT:   ASSISTANTS: Ventura Bruns, PA-C   ANESTHESIA:   regional and general  EBL:  50 mL   BLOOD ADMINISTERED:none  DRAINS: none   LOCAL MEDICATIONS USED:  MARCAINE     SPECIMEN:  No Specimen  DISPOSITION OF SPECIMEN:  N/A  COUNTS:  YES  TOURNIQUET:  * No tourniquets in log *  DICTATION: .Other Dictation: Dictation Number [id number]  PLAN OF CARE: Admit for overnight observation  PATIENT DISPOSITION:  PACU - hemodynamically stable.   Delay start of Pharmacological VTE agent (>24hrs) due to surgical blood loss or risk of bleeding: not applicable

## 2020-10-28 NOTE — Transfer of Care (Signed)
Immediate Anesthesia Transfer of Care Note  Patient: Shannon Gardner  Procedure(s) Performed: OPEN REDUCTION INTERNAL FIXATION (ORIF) PROXIMAL HUMERUS FRACTURE (Left)  Patient Location: PACU  Anesthesia Type:General  Level of Consciousness: drowsy  Airway & Oxygen Therapy: Patient Spontanous Breathing and Patient connected to face mask oxygen  Post-op Assessment: Report given to RN and Post -op Vital signs reviewed and stable  Post vital signs: Reviewed and stable  Last Vitals:  Vitals Value Taken Time  BP    Temp    Pulse    Resp    SpO2      Last Pain:  Vitals:   10/28/20 1600  TempSrc:   PainSc: 0-No pain      Patients Stated Pain Goal: 3 ([id number])  Complications: No notable events documented.

## 2020-10-28 NOTE — Anesthesia Preprocedure Evaluation (Addendum)
Anesthesia Evaluation  Patient identified by MRN, date of birth, ID band Patient awake    Reviewed: Allergy & Precautions, NPO status , Patient's Chart, lab work & pertinent test results  History of Anesthesia Complications (+) PONV and history of anesthetic complications  Airway Mallampati: III  TM Distance: >3 FB Neck ROM: Full    Dental  (+) Teeth Intact, Dental Advisory Given   Pulmonary former smoker,  Quit smoking 1990, on and off for 25 years   Pulmonary exam normal breath sounds clear to auscultation       Cardiovascular hypertension, Pt. on medications Normal cardiovascular exam Rhythm:Regular Rate:Normal     Neuro/Psych  Headaches, negative psych ROS   GI/Hepatic Neg liver ROS, GERD  Controlled,  Endo/Other  Hypothyroidism Morbid obesityBMI 50  Renal/GU Renal InsufficiencyRenal diseaseCr 1.18  negative genitourinary   Musculoskeletal L proximal humerus fx   Abdominal (+) + obese,   Peds  Hematology negative hematology ROS (+) hct 41.3   Anesthesia Other Findings   Reproductive/Obstetrics negative OB ROS                            Anesthesia Physical Anesthesia Plan  ASA: 3  Anesthesia Plan: General and Regional   Post-op Pain Management: GA combined w/ Regional for post-op pain   Induction: Intravenous  PONV Risk Score and Plan: 4 or greater and Ondansetron, Dexamethasone, Treatment may vary due to age or medical condition and Aprepitant  Airway Management Planned: Oral ETT  Additional Equipment: None  Intra-op Plan:   Post-operative Plan: Extubation in OR  Informed Consent: I have reviewed the patients History and Physical, chart, labs and discussed the procedure including the risks, benefits and alternatives for the proposed anesthesia with the patient or authorized representative who has indicated his/her understanding and acceptance.     Dental advisory  given  Plan Discussed with: CRNA  Anesthesia Plan Comments:        Anesthesia Quick Evaluation

## 2020-10-28 NOTE — Discharge Instructions (Signed)
Ice to the shoulder constantly.  Keep the incision covered and clean and dry for one week, then ok to get it wet in the shower.  Do very gentle exercise as instructed several times per day.  DO NOT reach behind your back or push up out of a chair with the operative arm.  Use a sling while you are up and around for comfort, may remove while seated.  Keep pillow propped behind the operative elbow.  Follow up with Dr Veverly Fells in two weeks in the office, call [phone] for appt

## 2020-10-28 NOTE — Anesthesia Procedure Notes (Signed)
Procedure Name: Intubation Date/Time: 10/28/2020 4:24 PM Performed by: Lissa Morales, CRNA Pre-anesthesia Checklist: Patient identified, Emergency Drugs available, Suction available and Patient being monitored Patient Re-evaluated:Patient Re-evaluated prior to induction Oxygen Delivery Method: Circle system utilized Preoxygenation: Pre-oxygenation with 100% oxygen Induction Type: IV induction Ventilation: Mask ventilation without difficulty Laryngoscope Size: Mac and 4 Grade View: Grade II Tube type: Oral Tube size: 7.0 mm Number of attempts: 1 Airway Equipment and Method: Stylet and Oral airway Placement Confirmation: ETT inserted through vocal cords under direct vision, positive ETCO2 and breath sounds checked- equal and bilateral Secured at: 23.5 cm Tube secured with: Tape Dental Injury: Teeth and Oropharynx as per pre-operative assessment

## 2020-10-28 NOTE — Interval H&P Note (Signed)
History and Physical Interval Note:  10/28/2020 3:39 PM  Shannon Gardner  has presented today for surgery, with the diagnosis of left proximal humerus fracture.  The various methods of treatment have been discussed with the patient and family. After consideration of risks, benefits and other options for treatment, the patient has consented to  Procedure(s) with comments: OPEN REDUCTION INTERNAL FIXATION (ORIF) PROXIMAL HUMERUS FRACTURE (Left) - with ISB as a surgical intervention.  The patient's history has been reviewed, patient examined, no change in status, stable for surgery.  I have reviewed the patient's chart and labs.  Questions were answered to the patient's satisfaction.     [name]

## 2020-10-28 NOTE — Op Note (Signed)
NAMESERRA, LIND. MEDICAL RECORD NO: [id number] ACCOUNT NO: [id number] DATE OF BIRTH: Jul 06, 1945 FACILITY: [facility] LOCATION: [id number] PHYSICIAN: Doran Heater. Veverly Fells, MD  Operative Report   DATE OF PROCEDURE: 10/28/2020  PREOPERATIVE DIAGNOSIS:  Left displaced proximal humerus fracture.  POSTOPERATIVE DIAGNOSIS:  Left displaced proximal humerus fracture.  PROCEDURE PERFORMED:  Left proximal humerus fracture open reduction and internal fixation using Biomet ALPS plate.  ATTENDING SURGEON:  Doran Heater. Veverly Fells, MD  ASSISTANT:  Charletta Cousin Dixon, [place], who was scrubbed during the entire procedure and necessary for satisfactory completion of surgery.  ANESTHESIA:  General anesthesia plus interscalene block anesthesia was used.  ESTIMATED BLOOD LOSS:  Less than 50 mL.  FLUID REPLACEMENT:  1000 mL crystalloid.  COUNTS:  Instrument counts correct.   COMPLICATIONS:  No complications.  ANTIBIOTICS:  Perioperative antibiotics were given.  INDICATIONS:  The patient is a 75 year old female who suffered a ground level fall injuring her left shoulder.  She was seen initially in the emergency department where she was diagnosed with a displaced surgical neck humerus fracture.  The patient  initially was placed in a sling and was sent for orthopedic followup. One week later, she presented with a displaced humeral fracture with greater than 50% of the shaft diameter displacement medially.  Due to this extreme displacement and concern of  ongoing pull of the pectoralis tendon on the distal fragment, we discussed options with the patient, recommending ORIF to realign the fracture and stabilize it.  The patient agreed.  Informed consent obtained.  DESCRIPTION OF PROCEDURE:  After an adequate level of anesthesia was achieved, the patient was positioned in the modified beach chair position.  Left shoulder was correctly identified and sterilely prepped and draped in the usual manner.  Timeout was  called  verifying correct patient, correct site.  We entered the shoulder using a deltopectoral approach starting at the coracoid process extending down to the anterior humerus.  Dissection down through subcutaneous tissues.  We identified the cephalic  vein and took that laterally with the deltoid.  Pectoralis was taken medially.  Conjoined tendon was identified and retracted medially.  We were able to identify the fractured humerus. I was able to do a very small entry through the soft tissue laterally  and placed a Cobb elevator over the top of the humeral shaft.  Used that basically as a shoehorn to translate the humeral shaft laterally and the head relatively medially. I was able to get the fracture aligned well on C-arm and then we placed an ALPS  contoured and locking humeral plate at the appropriate height. I placed a guide pin into the center of the humeral head. I made sure that was the appropriate height on AP and lateral views.  We then fixed the plate to the humeral shaft using the sliding  hole and a 3.5 cortical screw.  This was nonlocked.  Once we had the plate in, secured to the shaft, then I was able to place locking pegs proximally.  We used a total of 6 locking pegs into the humeral head to the appropriate depth. We used the C-arm to  visualize that, and we placed 2 more locking screws in the shaft of the appropriate length.  We got our final x-rays that showed appropriate alignment of her fracture as well as good placement of her hardware.  We irrigated thoroughly and closed with 0  Vicryl for the deltopectoral layer followed by 2-0 Vicryl for subcutaneous closure and staples for skin.  Sterile  dressing was applied, shoulder sling.  The patient was transported to recovery room in stable condition.   ROH D: 10/28/2020 5:53:11 pm T: 10/28/2020 11:27:00 pm  JOB: [id number]

## 2020-10-29 DIAGNOSIS — S42309A Unspecified fracture of shaft of humerus, unspecified arm, initial encounter for closed fracture: Secondary | ICD-10-CM | POA: Diagnosis not present

## 2020-10-29 DIAGNOSIS — E039 Hypothyroidism, unspecified: Secondary | ICD-10-CM | POA: Diagnosis not present

## 2020-10-29 DIAGNOSIS — Z7982 Long term (current) use of aspirin: Secondary | ICD-10-CM | POA: Diagnosis not present

## 2020-10-29 DIAGNOSIS — S42292A Other displaced fracture of upper end of left humerus, initial encounter for closed fracture: Secondary | ICD-10-CM | POA: Diagnosis not present

## 2020-10-29 DIAGNOSIS — I10 Essential (primary) hypertension: Secondary | ICD-10-CM | POA: Diagnosis not present

## 2020-10-29 DIAGNOSIS — M6281 Muscle weakness (generalized): Secondary | ICD-10-CM | POA: Diagnosis not present

## 2020-10-29 DIAGNOSIS — Z79899 Other long term (current) drug therapy: Secondary | ICD-10-CM | POA: Diagnosis not present

## 2020-10-29 DIAGNOSIS — Z87891 Personal history of nicotine dependence: Secondary | ICD-10-CM | POA: Diagnosis not present

## 2020-10-29 MED ORDER — OXYCODONE-ACETAMINOPHEN 5-325 MG PO TABS: 1.0000 | ORAL_TABLET | Freq: Four times a day (QID) | ORAL | 0 refills | Status: AC | PRN

## 2020-10-29 NOTE — Discharge Summary (Signed)
In most cases prophylactic antibiotics for Dental procdeures after total joint surgery are not necessary.  Exceptions are as follows:  1. History of prior total joint infection  2. Severely immunocompromised (Organ Transplant, cancer chemotherapy, Rheumatoid biologic meds such as [place])  3. Poorly controlled diabetes (A1C &gt; 8.0, blood glucose over 200)  If you have one of these conditions, contact your surgeon for an antibiotic prescription, prior to your dental procedure. Orthopedic Discharge Summary        Physician Discharge Summary  Patient ID: Shannon Gardner MRN: [id number] DOB/AGE: September 12, 1945 75 y.o.  Admit date: 10/28/2020 Discharge date: 10/29/2020   Procedures:  Procedure(s) (LRB): OPEN REDUCTION INTERNAL FIXATION (ORIF) PROXIMAL HUMERUS FRACTURE (Left)  Attending Physician:  Dr. Esmond Plants  Admission Diagnoses:   left shoulder displaced proximal humerus fracture  Discharge Diagnoses:  same   Past Medical History:  Diagnosis Date   Cancer [facility])    endometrial cancer   Cataract    not surgical yet   Fractured pelvis ([place])    GERD (gastroesophageal reflux disease)    Headache(784.0)    migraines "cluster headaches"-most days   High cholesterol    Hypertension    Hypothyroidism    PONV (postoperative nausea and vomiting)    Thyroid disease     [facility]P: [facility] [place]   Discharged Condition: good  Hospital Course:  Patient underwent the above stated procedure on 10/28/2020. Patient tolerated the procedure well and brought to the recovery room in good condition and subsequently to the floor. Patient had an uncomplicated hospital course and was stable for discharge.   Disposition:  with follow up in 2 weeks    Follow-up Information     Netta Cedars, MD. Call in 2 week(s).   Specialty: Orthopedic Surgery Why: call [phone] for appt Contact information: [address] [place]  [id number]                 Dental Antibiotics:  In most cases prophylactic antibiotics for Dental procdeures after total joint surgery are not necessary.  Exceptions are as follows:  1. History of prior total joint infection  2. Severely immunocompromised (Organ Transplant, cancer chemotherapy, Rheumatoid biologic meds such as [place])  3. Poorly controlled diabetes (A1C &gt; 8.0, blood glucose over 200)  If you have one of these conditions, contact your surgeon for an antibiotic prescription, prior to your dental procedure.    Allergies as of 10/29/2020       Reactions   Altace [ramipril] Hives   Amoxicillin Rash   Tussicaps [hydrocod Polst-cpm Polst Er] Rash        Medication List     STOP taking these medications    ibuprofen 200 MG tablet Commonly known as: ADVIL       TAKE these medications    acetaminophen 650 MG CR tablet Commonly known as: TYLENOL Take 650 mg by mouth every 8 (eight) hours as needed for pain.   amLODipine 2.5 MG tablet Commonly known as: NORVASC Take 2.5 mg by mouth daily.   ASPERCREME EX Apply 1 application topically daily as needed (knee pain).   aspirin 81 MG tablet Take 81 mg by mouth at bedtime.   BLUE-EMU MAXIMUM STRENGTH EX Apply 1 application topically daily as needed (knee pain).   celecoxib 200 MG capsule Commonly known as: CELEBREX Take 200 mg by mouth daily.   Cholecalciferol 250 MCG (10000 UT) Caps Take 10,000 Units by mouth 3 (three) times a week.  Sun, Wed, and Fri   ezetimibe 10 MG tablet Commonly known as: ZETIA Take 10 mg by mouth at bedtime.   gabapentin 100 MG capsule Commonly known as: NEURONTIN Take 100 mg by mouth daily as needed (pain).   levothyroxine 112 MCG tablet Commonly known as: SYNTHROID Take 112 mcg by mouth daily before breakfast. No Substitutions " brand name Synthroid only"   ondansetron 4 MG disintegrating tablet Commonly known as: ZOFRAN-ODT Take 4 mg by mouth  every 6 (six) hours as needed for nausea/vomiting.   oxyCODONE-acetaminophen 5-325 MG tablet Commonly known as: Percocet Take 1 tablet by mouth every 6 (six) hours as needed for severe pain.   Systane Complete 0.6 % Soln Generic drug: Propylene Glycol Place 1 drop into both eyes daily as needed (dry eyes).   telmisartan-hydrochlorothiazide 80-25 MG tablet Commonly known as: MICARDIS HCT Take 0.5 tablets by mouth 2 (two) times daily.   traMADol 50 MG tablet Commonly known as: Ultram Take 1 tablet (50 mg total) by mouth every 6 (six) hours as needed for moderate pain or severe pain.   vitamin C 1000 MG tablet Take 1,000 mg by mouth at bedtime.   zinc gluconate 50 MG tablet Take 50 mg by mouth at bedtime.               Durable Medical Equipment  (From admission, onward)           Start     Ordered   10/29/20 0908  For home use only DME 3 n 1  Once        10/29/20 [id number]              Signed: [name] 10/29/2020, 9:10 AM  [facility] now [facility] [address] [place], [place], [place] [address] Phone: [place]

## 2020-10-29 NOTE — Progress Notes (Signed)
Orthopedics Progress Note  Subjective: No complaints. Block still working  Objective:  Vitals:   10/29/20 0134 10/29/20 0555  BP: 119/62 133/63  Pulse: 73 71  Resp: 15 15  Temp: 98 F (36.7 C) 98.1 F (36.7 C)  SpO2: 95% 97%    General: Awake and alert  Musculoskeletal: Left shoulder dressing CDI,  Neurovascularly intact  Lab Results  Component Value Date   WBC 8.7 10/27/2020   HGB 13.1 10/27/2020   HCT 41.3 10/27/2020   MCV 96.7 10/27/2020   PLT 292 10/27/2020       Component Value Date/Time   NA 139 10/27/2020 0931   K 4.1 10/27/2020 0931   CL 106 10/27/2020 0931   CO2 24 10/27/2020 0931   GLUCOSE 101 (H) 10/27/2020 0931   BUN 23 10/27/2020 0931   CREATININE 1.18 (H) 10/27/2020 0931   CALCIUM 9.7 10/27/2020 0931   GFRNONAA 48 (L) 10/27/2020 0931   GFRAA 81 (L) 12/28/2013 1537    Lab Results  Component Value Date   INR 1.01 04/23/2017   INR 1.08 12/28/2013    Assessment/Plan: POD #1 s/p Procedure(s): OPEN REDUCTION INTERNAL FIXATION (ORIF) PROXIMAL HUMERUS FRACTURE Stable for discharge home Follow up in two weeks  Doran Heater. Veverly Fells, MD 10/29/2020 9:07 AM

## 2020-10-29 NOTE — Plan of Care (Signed)
Pt stable to d/c home today. No needs at this time. Pt dressing dry, clean, and intact.

## 2020-10-29 NOTE — Progress Notes (Signed)
Pt stable at time of d/c instructions and education. No needs at time of d/c. PT surgical dressing clean, dry and intact. Pt provided with DME three in one prior to d/c.Marland Kitchen

## 2020-10-29 NOTE — Anesthesia Postprocedure Evaluation (Signed)
Anesthesia Post Note  Patient: Shannon Gardner  Procedure(s) Performed: OPEN REDUCTION INTERNAL FIXATION (ORIF) PROXIMAL HUMERUS FRACTURE (Left)     Patient location during evaluation: PACU Anesthesia Type: Regional and General Level of consciousness: awake and alert Pain management: pain level controlled Vital Signs Assessment: post-procedure vital signs reviewed and stable Respiratory status: spontaneous breathing, nonlabored ventilation, respiratory function stable and patient connected to nasal cannula oxygen Cardiovascular status: blood pressure returned to baseline and stable Postop Assessment: no apparent nausea or vomiting Anesthetic complications: no   No notable events documented.  Last Vitals:  Vitals:   10/29/20 0555 10/29/20 0926  BP: 133/63 (!) 122/58  Pulse: 71 77  Resp: 15 16  Temp: 36.7 C (!) 36.3 C  SpO2: 97% 98%    Last Pain:  Vitals:   10/29/20 0916  TempSrc:   PainSc: 3                  Khasir Woodrome

## 2020-10-29 NOTE — Evaluation (Signed)
Occupational Therapy Evaluation Patient Details Name: Shannon Gardner MRN: [id number] DOB: 1945-09-14 Today's Date: 10/29/2020    History of Present Illness Patient is a 75 year old woman s/p ORIF of left humeral fracture after  fall at home.   Clinical Impression   Shannon Gardner is a 75 year old woman s/p humeral ORIF without functional use of left non-dominant upper extremity secondary to effects of surgery, interscalene block and shoulder precautions. Therapist provided education and instruction to patient in regards to exercises, precautions, positioning, donning upper extremity clothing and bathing while maintaining shoulder precautions, ice and edema management and donning/doffing sling. Patient verbalized understanding and demonstrated as needed. Patient needed assistance to donn shirt, underwear, and pants, and provided with instruction on compensatory strategies to perform ADLs. Handouts provided to maximize retention of education. Patient to follow up with MD for further therapy needs.      Follow Up Recommendations  Follow surgeon's recommendation for DC plan and follow-up therapies;No OT follow up    Equipment Recommendations  None recommended by OT    Recommendations for Other Services       Precautions / Restrictions Precautions Precautions: Shoulder Type of Shoulder Precautions: No AROm, No PROM of shoulder Shoulder Interventions: Shoulder sling/immobilizer;At all times;Off for dressing/bathing/exercises Precaution Booklet Issued:  (handouts) Required Braces or Orthoses: Sling Restrictions Weight Bearing Restrictions: Yes LUE Weight Bearing: Non weight bearing      Mobility Bed Mobility Overal bed mobility: Needs Assistance Bed Mobility: Supine to Sit     Supine to sit: Supervision;HOB elevated          Transfers Overall transfer level: Needs assistance Equipment used: None Transfers: Sit to/from Stand           General transfer  comment: min assist to rise from bed surface and toilet    Balance Overall balance assessment: Mild deficits observed, not formally tested                                         ADL either performed or assessed with clinical judgement   ADL Overall ADL's : Needs assistance/impaired Eating/Feeding: Modified independent   Grooming: Modified independent   Upper Body Bathing: Minimal assistance;Sitting;Adhering to UE precautions   Lower Body Bathing: Minimal assistance;Sit to/from stand   Upper Body Dressing : Maximal assistance;Sitting;Adhering to UE precautions   Lower Body Dressing: Minimal assistance;Sit to/from stand Lower Body Dressing Details (indicate cue type and reason): to pull up clothing over left hip Toilet Transfer: Minimal assistance Toilet Transfer Details (indicate cue type and reason): min assist to rise Toileting- Clothing Manipulation and Hygiene: Supervision/safety;Sitting/lateral lean               Vision Patient Visual Report: No change from baseline       Perception     Praxis      Pertinent Vitals/Pain Pain Assessment: No/denies pain (secondary to effects of block)     Hand Dominance Right   Extremity/Trunk Assessment Upper Extremity Assessment Upper Extremity Assessment: LUE deficits/detail LUE Deficits / Details: Non functional use of LUE secondary to block and shoulder precautions   Lower Extremity Assessment Lower Extremity Assessment: Overall WFL for tasks assessed   Cervical / Trunk Assessment Cervical / Trunk Assessment: Normal   Communication Communication Communication: No difficulties   Cognition Arousal/Alertness: Awake/alert Behavior During Therapy: WFL for tasks assessed/performed Overall Cognitive Status: Within Functional Limits for  tasks assessed                                     General Comments       Exercises     Shoulder Instructions      Home Living Family/patient  expects to be discharged to:: Private residence Living Arrangements: Spouse/significant other   Type of Home: House                       Home Equipment: Gilford Rile - 2 wheels;Cane - single point          Prior Functioning/Environment Level of Independence: Independent                 OT Problem List: Decreased range of motion;Decreased strength;Impaired UE functional use;Obesity;Pain;Increased edema;Impaired balance (sitting and/or standing);Decreased knowledge of precautions      OT Treatment/Interventions:      OT Goals(Current goals can be found in the care plan section) Acute Rehab OT Goals OT Goal Formulation: All assessment and education complete, DC therapy  OT Frequency:     Barriers to D/C:            Co-evaluation              AM-PAC OT "6 Clicks" Daily Activity     Outcome Measure Help from another person eating meals?: None Help from another person taking care of personal grooming?: None Help from another person toileting, which includes using toliet, bedpan, or urinal?: A Little Help from another person bathing (including washing, rinsing, drying)?: A Little Help from another person to put on and taking off regular upper body clothing?: A Lot Help from another person to put on and taking off regular lower body clothing?: A Little 6 Click Score: 19   End of Session Nurse Communication: Mobility status  Activity Tolerance: Patient tolerated treatment well Patient left: in chair;with call bell/phone within reach  OT Visit Diagnosis: Pain;Muscle weakness (generalized) (M62.81)                Time: [id number] OT Time Calculation (min): 24 min Charges:  OT General Charges $OT Visit: 1 Visit OT Evaluation $OT Eval Low Complexity: 1 Low OT Treatments $Self Care/Home Management : 8-22 mins  Bynum Mccullars, OTR/L [place]  Office [phone] Pager: [place] 10/29/2020, 8:59 AM

## 2020-10-29 NOTE — TOC Transition Note (Signed)
Transition of Care [facility]) - CM/SW Discharge Note   Patient Details  Name: Shannon Gardner MRN: [id number] Date of Birth: 05/24/1945  Transition of Care [facility]) CM/SW Contact:  Leeroy Cha, RN Phone Number: 10/29/2020, 9:10 AM   Clinical Narrative:    3 in 1 ordered through [employer] at 0907.         Patient Goals and CMS Choice     Choice offered to / list presented to : Patient  Discharge Placement                       Discharge Plan and Services   Discharge Planning Services: CM Consult Post Acute Care Choice: Durable Medical Equipment          DME Arranged: 3-N-1 DME Agency: [employer] Date DME Agency Contacted: 10/29/20 Time DME Agency Contacted: [phone] Representative spoke with at DME Agency: [email or web address]            Social Determinants of Health ([place]) Interventions     Readmission Risk Interventions No flowsheet data found.

## 2020-11-02 ENCOUNTER — Encounter (HOSPITAL_COMMUNITY): Payer: Self-pay | Admitting: Orthopedic Surgery

## 2020-11-09 DIAGNOSIS — S42222D 2-part displaced fracture of surgical neck of left humerus, subsequent encounter for fracture with routine healing: Secondary | ICD-10-CM | POA: Diagnosis not present

## 2020-12-07 DIAGNOSIS — Z4789 Encounter for other orthopedic aftercare: Secondary | ICD-10-CM | POA: Diagnosis not present

## 2020-12-13 DIAGNOSIS — M25512 Pain in left shoulder: Secondary | ICD-10-CM | POA: Diagnosis not present

## 2020-12-15 DIAGNOSIS — M25512 Pain in left shoulder: Secondary | ICD-10-CM | POA: Diagnosis not present

## 2020-12-21 DIAGNOSIS — M25512 Pain in left shoulder: Secondary | ICD-10-CM | POA: Diagnosis not present

## 2020-12-23 DIAGNOSIS — M25512 Pain in left shoulder: Secondary | ICD-10-CM | POA: Diagnosis not present

## 2020-12-27 DIAGNOSIS — M25512 Pain in left shoulder: Secondary | ICD-10-CM | POA: Diagnosis not present

## 2020-12-27 DIAGNOSIS — I129 Hypertensive chronic kidney disease with stage 1 through stage 4 chronic kidney disease, or unspecified chronic kidney disease: Secondary | ICD-10-CM | POA: Diagnosis not present

## 2020-12-27 DIAGNOSIS — E559 Vitamin D deficiency, unspecified: Secondary | ICD-10-CM | POA: Diagnosis not present

## 2020-12-27 DIAGNOSIS — E7800 Pure hypercholesterolemia, unspecified: Secondary | ICD-10-CM | POA: Diagnosis not present

## 2020-12-27 DIAGNOSIS — R7309 Other abnormal glucose: Secondary | ICD-10-CM | POA: Diagnosis not present

## 2020-12-27 DIAGNOSIS — E039 Hypothyroidism, unspecified: Secondary | ICD-10-CM | POA: Diagnosis not present

## 2020-12-30 DIAGNOSIS — M25512 Pain in left shoulder: Secondary | ICD-10-CM | POA: Diagnosis not present

## 2021-01-02 DIAGNOSIS — M25512 Pain in left shoulder: Secondary | ICD-10-CM | POA: Diagnosis not present

## 2021-01-03 DIAGNOSIS — N1832 Chronic kidney disease, stage 3b: Secondary | ICD-10-CM | POA: Diagnosis not present

## 2021-01-03 DIAGNOSIS — E039 Hypothyroidism, unspecified: Secondary | ICD-10-CM | POA: Diagnosis not present

## 2021-01-03 DIAGNOSIS — M170 Bilateral primary osteoarthritis of knee: Secondary | ICD-10-CM | POA: Diagnosis not present

## 2021-01-03 DIAGNOSIS — I129 Hypertensive chronic kidney disease with stage 1 through stage 4 chronic kidney disease, or unspecified chronic kidney disease: Secondary | ICD-10-CM | POA: Diagnosis not present

## 2021-01-03 DIAGNOSIS — Z0000 Encounter for general adult medical examination without abnormal findings: Secondary | ICD-10-CM | POA: Diagnosis not present

## 2021-01-03 DIAGNOSIS — E7800 Pure hypercholesterolemia, unspecified: Secondary | ICD-10-CM | POA: Diagnosis not present

## 2021-01-03 DIAGNOSIS — E559 Vitamin D deficiency, unspecified: Secondary | ICD-10-CM | POA: Diagnosis not present

## 2021-01-03 DIAGNOSIS — M810 Age-related osteoporosis without current pathological fracture: Secondary | ICD-10-CM | POA: Diagnosis not present

## 2021-01-04 DIAGNOSIS — M25512 Pain in left shoulder: Secondary | ICD-10-CM | POA: Diagnosis not present

## 2021-01-04 DIAGNOSIS — Z4789 Encounter for other orthopedic aftercare: Secondary | ICD-10-CM | POA: Diagnosis not present

## 2021-01-20 DIAGNOSIS — Z23 Encounter for immunization: Secondary | ICD-10-CM | POA: Diagnosis not present

## 2021-02-24 DIAGNOSIS — M545 Low back pain, unspecified: Secondary | ICD-10-CM | POA: Diagnosis not present

## 2021-02-24 DIAGNOSIS — M25552 Pain in left hip: Secondary | ICD-10-CM | POA: Diagnosis not present

## 2021-03-02 DIAGNOSIS — M25552 Pain in left hip: Secondary | ICD-10-CM | POA: Diagnosis not present

## 2021-03-03 DIAGNOSIS — M25552 Pain in left hip: Secondary | ICD-10-CM | POA: Diagnosis not present

## 2021-03-06 DIAGNOSIS — M25552 Pain in left hip: Secondary | ICD-10-CM | POA: Diagnosis not present

## 2021-03-07 DIAGNOSIS — Z4789 Encounter for other orthopedic aftercare: Secondary | ICD-10-CM | POA: Diagnosis not present

## 2021-03-24 DIAGNOSIS — I129 Hypertensive chronic kidney disease with stage 1 through stage 4 chronic kidney disease, or unspecified chronic kidney disease: Secondary | ICD-10-CM | POA: Diagnosis not present

## 2021-03-24 DIAGNOSIS — N1832 Chronic kidney disease, stage 3b: Secondary | ICD-10-CM | POA: Diagnosis not present

## 2021-03-24 DIAGNOSIS — E7800 Pure hypercholesterolemia, unspecified: Secondary | ICD-10-CM | POA: Diagnosis not present

## 2021-03-24 DIAGNOSIS — M810 Age-related osteoporosis without current pathological fracture: Secondary | ICD-10-CM | POA: Diagnosis not present

## 2021-04-03 DIAGNOSIS — M1712 Unilateral primary osteoarthritis, left knee: Secondary | ICD-10-CM | POA: Diagnosis not present

## 2021-04-03 DIAGNOSIS — M25552 Pain in left hip: Secondary | ICD-10-CM | POA: Diagnosis not present

## 2021-05-12 DIAGNOSIS — Z1231 Encounter for screening mammogram for malignant neoplasm of breast: Secondary | ICD-10-CM | POA: Diagnosis not present

## 2021-05-17 ENCOUNTER — Encounter: Payer: Self-pay | Admitting: Obstetrics & Gynecology

## 2021-05-19 ENCOUNTER — Ambulatory Visit: Payer: Medicare Other | Admitting: Obstetrics & Gynecology

## 2021-05-29 ENCOUNTER — Ambulatory Visit (INDEPENDENT_AMBULATORY_CARE_PROVIDER_SITE_OTHER): Payer: Medicare Other | Admitting: Obstetrics & Gynecology

## 2021-05-29 ENCOUNTER — Encounter: Payer: Self-pay | Admitting: Obstetrics & Gynecology

## 2021-05-29 ENCOUNTER — Other Ambulatory Visit: Payer: Self-pay

## 2021-05-29 VITALS — BP 128/70 | HR 88 | Resp 18 | Ht 63.5 in | Wt 282.0 lb

## 2021-05-29 DIAGNOSIS — Z6841 Body Mass Index (BMI) 40.0 and over, adult: Secondary | ICD-10-CM

## 2021-05-29 DIAGNOSIS — Z01419 Encounter for gynecological examination (general) (routine) without abnormal findings: Secondary | ICD-10-CM | POA: Diagnosis not present

## 2021-05-29 DIAGNOSIS — C541 Malignant neoplasm of endometrium: Secondary | ICD-10-CM

## 2021-05-29 DIAGNOSIS — M810 Age-related osteoporosis without current pathological fracture: Secondary | ICD-10-CM

## 2021-05-29 DIAGNOSIS — Z780 Asymptomatic menopausal state: Secondary | ICD-10-CM

## 2021-05-29 NOTE — Progress Notes (Signed)
? ? ?[name] 1945-05-10 [id number] ? ? ?History:    76 y.o.  G10P4L4  Married ?  ?RP: Established patient presenting for Annual Gynecologic visit ?  ?HPI: Postmenopause, well on no hormone replacement therapy.  Status post  robotic total laparoscopic hysterectomy with bilateral salpingo-oophorectomy and bilateral pelvic lymph node dissection with Dr. Alycia Rossetti in October 2014.  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  No adjuvant therapy was done. No pelvic pain.  Pap on vagina Negative 04/2020.  Will repeat Pap every 2-3 years.  Abstinent.  3 years ago, she had unexplained pelvic fractures on 2 different occasions.  The fractures were documented by MRI and per patient no evidence of malignancy is suspected as the cause.  She stopped the Prolia because of side effects.  Last BD 03/2018, will schedule a repeat BD now.   Urine and bowel movements normal. Breasts normal. Screening mamm[facility] done [facility]/03/2021 at [facility], will obtain report. Health labs with Fam MD.  [name] 2019.  BMI 49.17. ? ? ?Past medical history,surgical history, family history and social history were all reviewed and documented in the EPIC chart. ? ?Gynecologic History ?No LMP recorded. Patient has had a hysterectomy. ? ?Obstetric History ?OB History  ?Gravida Para Term Preterm AB Living  ?'4 4       4  '$ ?SAB IAB Ectopic Multiple Live Births  ?           ?  ?# Outcome Date GA Lbr Len/2nd Weight Sex Delivery Anes PTL Lv  ?4 Para           ?3 Para           ?2 Para           ?1 Para           ? ? ? ?ROS: A ROS was performed and pertinent positives and negatives are included in the history. ? GENERAL: No fevers or chills. HEENT: No change in vision, no earache, sore throat or sinus congestion. NECK: No pain or stiffness. CARDIOVASCULAR: No chest pain or pressure. No palpitations. PULMONARY: No shortness of breath, cough or wheeze. GASTROINTESTINAL: No abdominal pain, nausea, vomiting or diarrhea, melena or  bright red blood per rectum. GENITOURINARY: No urinary frequency, urgency, hesitancy or dysuria. MUSCULOSKELETAL: No joint or muscle pain, no back pain, no recent trauma. DERMATOLOGIC: No rash, no itching, no lesions. ENDOCRINE: No polyuria, polydipsia, no heat or cold intolerance. No recent change in weight. HEMATOLOGICAL: No anemia or easy bruising or bleeding. NEUROLOGIC: No headache, seizures, numbness, tingling or weakness. PSYCHIATRIC: No depression, no loss of interest in normal activity or change in sleep pattern.  ?  ? ?Exam: ? ? ?BP 128/70 (BP Location: Right Arm, Patient Position: Sitting, Cuff Size: Large)   Pulse 88   Resp 18   Ht 5' 3.5" (1.613 m)   Wt 282 lb (127.9 kg)   BMI 49.17 kg/m?  ? ?Body mass index is 49.17 kg/m?. ? ?General appearance : Well developed well nourished female. No acute distress ?HEENT: Eyes: no retinal hemorrhage or exudates,  Neck supple, trachea midline, no carotid bruits, no thyroidmegaly ?Lungs: Clear to auscultation, no rhonchi or wheezes, or rib retractions  ?Heart: Regular rate and rhythm, no murmurs or gallops ?Breast:Examined in sitting and supine position were symmetrical in appearance, no palpable masses or tenderness,  no skin retraction, no nipple inversion, no nipple discharge, no skin discoloration, no axillary or supraclavicular lymphadenopathy ?Abdomen: no  palpable masses or tenderness, no rebound or guarding ?Extremities: no edema or skin discoloration or tenderness ? ?Pelvic: Vulva: Normal ?            Vagina: No gross lesions or discharge ? Cervix/Uterus absent ? Adnexa  Without masses or tenderness ? Anus: Normal ? ? ?Assessment/Plan:  76 y.o. female for annual exam  ? ?1. Well female exam with routine gynecological exam ?Postmenopause, well on no hormone replacement therapy.  Status post  robotic total laparoscopic hysterectomy with bilateral salpingo-oophorectomy and bilateral pelvic lymph node dissection with Dr. Alycia Rossetti in October 2014.  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  No adjuvant therapy was done. No pelvic pain.  Pap on vagina Negative 04/2020.  Will repeat Pap every 2-3 years.  Abstinent.  3 years ago, she had unexplained pelvic fractures on 2 different occasions.  The fractures were documented by MRI and per patient no evidence of malignancy is suspected as the cause.  She stopped the Prolia because of side effects.  Last BD 03/2018, will schedule a repeat BD now.   Urine and bowel movements normal. Breasts normal. Screening mammo done 05/03/2021 at [facility] will obtain report. Health labs with Fam MD.  [name] 2019.  BMI 49.17. ? ?2. Postmenopause ?Postmenopause, well on no hormone replacement therapy.  ? ?3. Endometrial cancer ([place]) ?Status post  robotic total laparoscopic hysterectomy with bilateral salpingo-oophorectomy and bilateral pelvic lymph node dissection with Dr. Alycia Rossetti in October 2014.  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  No adjuvant therapy was done. No pelvic pain.  Pap on vagina Negative 04/2020.  Will repeat Pap every 2-3 years.  Abstinent.  ? ?4. Age-related osteoporosis without current pathological fracture ?3 years ago, she had unexplained pelvic fractures on 2 different occasions.  The fractures were documented by MRI and per patient no evidence of malignancy is suspected as the cause.  She stopped the Prolia because of side effects.  Last BD 03/2018, will schedule a repeat BD now. Vit D, Ca++, wt bearing. ? ?5. Class 3 severe obesity due to excess calories with serious comorbidity and body mass index (BMI) of 45.0 to 49.9 in adult [facility])  ?Low calorie/carb diet.  Walking. ? ?[name] MD, 11:27 AM 05/29/2021 ? ?  ?

## 2021-06-27 DIAGNOSIS — E7800 Pure hypercholesterolemia, unspecified: Secondary | ICD-10-CM | POA: Diagnosis not present

## 2021-06-27 DIAGNOSIS — E039 Hypothyroidism, unspecified: Secondary | ICD-10-CM | POA: Diagnosis not present

## 2021-06-27 DIAGNOSIS — E559 Vitamin D deficiency, unspecified: Secondary | ICD-10-CM | POA: Diagnosis not present

## 2021-06-27 DIAGNOSIS — N1832 Chronic kidney disease, stage 3b: Secondary | ICD-10-CM | POA: Diagnosis not present

## 2021-07-04 DIAGNOSIS — Z961 Presence of intraocular lens: Secondary | ICD-10-CM | POA: Diagnosis not present

## 2021-07-04 DIAGNOSIS — H52203 Unspecified astigmatism, bilateral: Secondary | ICD-10-CM | POA: Diagnosis not present

## 2021-07-10 DIAGNOSIS — E039 Hypothyroidism, unspecified: Secondary | ICD-10-CM | POA: Diagnosis not present

## 2021-07-10 DIAGNOSIS — R7309 Other abnormal glucose: Secondary | ICD-10-CM | POA: Diagnosis not present

## 2021-07-10 DIAGNOSIS — I129 Hypertensive chronic kidney disease with stage 1 through stage 4 chronic kidney disease, or unspecified chronic kidney disease: Secondary | ICD-10-CM | POA: Diagnosis not present

## 2021-07-10 DIAGNOSIS — E559 Vitamin D deficiency, unspecified: Secondary | ICD-10-CM | POA: Diagnosis not present

## 2021-07-10 DIAGNOSIS — M810 Age-related osteoporosis without current pathological fracture: Secondary | ICD-10-CM | POA: Diagnosis not present

## 2021-07-10 DIAGNOSIS — E7800 Pure hypercholesterolemia, unspecified: Secondary | ICD-10-CM | POA: Diagnosis not present

## 2021-07-10 DIAGNOSIS — N1832 Chronic kidney disease, stage 3b: Secondary | ICD-10-CM | POA: Diagnosis not present

## 2021-07-10 DIAGNOSIS — M170 Bilateral primary osteoarthritis of knee: Secondary | ICD-10-CM | POA: Diagnosis not present

## 2021-09-21 DIAGNOSIS — G9601 Cranial cerebrospinal fluid leak, spontaneous: Secondary | ICD-10-CM | POA: Diagnosis not present

## 2021-09-21 DIAGNOSIS — Z9622 Myringotomy tube(s) status: Secondary | ICD-10-CM | POA: Diagnosis not present

## 2021-12-29 DIAGNOSIS — Z23 Encounter for immunization: Secondary | ICD-10-CM | POA: Diagnosis not present

## 2022-01-01 DIAGNOSIS — E7800 Pure hypercholesterolemia, unspecified: Secondary | ICD-10-CM | POA: Diagnosis not present

## 2022-01-01 DIAGNOSIS — N1832 Chronic kidney disease, stage 3b: Secondary | ICD-10-CM | POA: Diagnosis not present

## 2022-01-01 DIAGNOSIS — E559 Vitamin D deficiency, unspecified: Secondary | ICD-10-CM | POA: Diagnosis not present

## 2022-01-01 DIAGNOSIS — I129 Hypertensive chronic kidney disease with stage 1 through stage 4 chronic kidney disease, or unspecified chronic kidney disease: Secondary | ICD-10-CM | POA: Diagnosis not present

## 2022-01-01 DIAGNOSIS — E039 Hypothyroidism, unspecified: Secondary | ICD-10-CM | POA: Diagnosis not present

## 2022-01-08 DIAGNOSIS — R011 Cardiac murmur, unspecified: Secondary | ICD-10-CM | POA: Diagnosis not present

## 2022-01-08 DIAGNOSIS — E039 Hypothyroidism, unspecified: Secondary | ICD-10-CM | POA: Diagnosis not present

## 2022-01-08 DIAGNOSIS — M810 Age-related osteoporosis without current pathological fracture: Secondary | ICD-10-CM | POA: Diagnosis not present

## 2022-01-08 DIAGNOSIS — Z630 Problems in relationship with spouse or partner: Secondary | ICD-10-CM | POA: Diagnosis not present

## 2022-01-08 DIAGNOSIS — M170 Bilateral primary osteoarthritis of knee: Secondary | ICD-10-CM | POA: Diagnosis not present

## 2022-01-08 DIAGNOSIS — Z0000 Encounter for general adult medical examination without abnormal findings: Secondary | ICD-10-CM | POA: Diagnosis not present

## 2022-06-01 ENCOUNTER — Encounter: Payer: Self-pay | Admitting: Obstetrics & Gynecology

## 2022-06-01 DIAGNOSIS — Z1231 Encounter for screening mammogram for malignant neoplasm of breast: Secondary | ICD-10-CM | POA: Diagnosis not present

## 2022-07-10 DIAGNOSIS — Z961 Presence of intraocular lens: Secondary | ICD-10-CM | POA: Diagnosis not present

## 2022-07-10 DIAGNOSIS — H353121 Nonexudative age-related macular degeneration, left eye, early dry stage: Secondary | ICD-10-CM | POA: Diagnosis not present

## 2022-07-10 DIAGNOSIS — H52203 Unspecified astigmatism, bilateral: Secondary | ICD-10-CM | POA: Diagnosis not present

## 2022-07-11 DIAGNOSIS — R946 Abnormal results of thyroid function studies: Secondary | ICD-10-CM | POA: Diagnosis not present

## 2022-07-11 DIAGNOSIS — R7309 Other abnormal glucose: Secondary | ICD-10-CM | POA: Diagnosis not present

## 2022-07-11 DIAGNOSIS — Z13220 Encounter for screening for lipoid disorders: Secondary | ICD-10-CM | POA: Diagnosis not present

## 2022-07-11 DIAGNOSIS — Z79899 Other long term (current) drug therapy: Secondary | ICD-10-CM | POA: Diagnosis not present

## 2022-07-18 DIAGNOSIS — E039 Hypothyroidism, unspecified: Secondary | ICD-10-CM | POA: Diagnosis not present

## 2022-07-18 DIAGNOSIS — E559 Vitamin D deficiency, unspecified: Secondary | ICD-10-CM | POA: Diagnosis not present

## 2022-07-18 DIAGNOSIS — M170 Bilateral primary osteoarthritis of knee: Secondary | ICD-10-CM | POA: Diagnosis not present

## 2022-07-18 DIAGNOSIS — M810 Age-related osteoporosis without current pathological fracture: Secondary | ICD-10-CM | POA: Diagnosis not present

## 2022-07-18 DIAGNOSIS — N1832 Chronic kidney disease, stage 3b: Secondary | ICD-10-CM | POA: Diagnosis not present

## 2022-07-18 DIAGNOSIS — I129 Hypertensive chronic kidney disease with stage 1 through stage 4 chronic kidney disease, or unspecified chronic kidney disease: Secondary | ICD-10-CM | POA: Diagnosis not present

## 2022-07-18 DIAGNOSIS — E7800 Pure hypercholesterolemia, unspecified: Secondary | ICD-10-CM | POA: Diagnosis not present

## 2023-01-03 DIAGNOSIS — E559 Vitamin D deficiency, unspecified: Secondary | ICD-10-CM | POA: Diagnosis not present

## 2023-01-03 DIAGNOSIS — I129 Hypertensive chronic kidney disease with stage 1 through stage 4 chronic kidney disease, or unspecified chronic kidney disease: Secondary | ICD-10-CM | POA: Diagnosis not present

## 2023-01-03 DIAGNOSIS — E039 Hypothyroidism, unspecified: Secondary | ICD-10-CM | POA: Diagnosis not present

## 2023-01-03 DIAGNOSIS — E7800 Pure hypercholesterolemia, unspecified: Secondary | ICD-10-CM | POA: Diagnosis not present

## 2023-01-03 DIAGNOSIS — M810 Age-related osteoporosis without current pathological fracture: Secondary | ICD-10-CM | POA: Diagnosis not present

## 2023-01-10 DIAGNOSIS — J069 Acute upper respiratory infection, unspecified: Secondary | ICD-10-CM | POA: Diagnosis not present

## 2023-01-10 DIAGNOSIS — I129 Hypertensive chronic kidney disease with stage 1 through stage 4 chronic kidney disease, or unspecified chronic kidney disease: Secondary | ICD-10-CM | POA: Diagnosis not present

## 2023-01-10 DIAGNOSIS — E7800 Pure hypercholesterolemia, unspecified: Secondary | ICD-10-CM | POA: Diagnosis not present

## 2023-01-10 DIAGNOSIS — Z0000 Encounter for general adult medical examination without abnormal findings: Secondary | ICD-10-CM | POA: Diagnosis not present

## 2023-01-10 DIAGNOSIS — N1832 Chronic kidney disease, stage 3b: Secondary | ICD-10-CM | POA: Diagnosis not present

## 2023-01-10 DIAGNOSIS — E039 Hypothyroidism, unspecified: Secondary | ICD-10-CM | POA: Diagnosis not present

## 2023-01-10 DIAGNOSIS — R059 Cough, unspecified: Secondary | ICD-10-CM | POA: Diagnosis not present

## 2023-01-10 DIAGNOSIS — R011 Cardiac murmur, unspecified: Secondary | ICD-10-CM | POA: Diagnosis not present

## 2023-06-07 DIAGNOSIS — Z1231 Encounter for screening mammogram for malignant neoplasm of breast: Secondary | ICD-10-CM | POA: Diagnosis not present

## 2023-07-11 DIAGNOSIS — N1832 Chronic kidney disease, stage 3b: Secondary | ICD-10-CM | POA: Diagnosis not present

## 2023-07-11 DIAGNOSIS — E538 Deficiency of other specified B group vitamins: Secondary | ICD-10-CM | POA: Diagnosis not present

## 2023-07-11 DIAGNOSIS — I129 Hypertensive chronic kidney disease with stage 1 through stage 4 chronic kidney disease, or unspecified chronic kidney disease: Secondary | ICD-10-CM | POA: Diagnosis not present

## 2023-07-11 DIAGNOSIS — E039 Hypothyroidism, unspecified: Secondary | ICD-10-CM | POA: Diagnosis not present

## 2023-07-16 DIAGNOSIS — H353121 Nonexudative age-related macular degeneration, left eye, early dry stage: Secondary | ICD-10-CM | POA: Diagnosis not present

## 2023-07-16 DIAGNOSIS — H52203 Unspecified astigmatism, bilateral: Secondary | ICD-10-CM | POA: Diagnosis not present

## 2023-07-16 DIAGNOSIS — Z961 Presence of intraocular lens: Secondary | ICD-10-CM | POA: Diagnosis not present

## 2023-07-18 DIAGNOSIS — E7800 Pure hypercholesterolemia, unspecified: Secondary | ICD-10-CM | POA: Diagnosis not present

## 2023-07-18 DIAGNOSIS — E039 Hypothyroidism, unspecified: Secondary | ICD-10-CM | POA: Diagnosis not present

## 2023-07-18 DIAGNOSIS — I129 Hypertensive chronic kidney disease with stage 1 through stage 4 chronic kidney disease, or unspecified chronic kidney disease: Secondary | ICD-10-CM | POA: Diagnosis not present

## 2023-07-18 DIAGNOSIS — N1832 Chronic kidney disease, stage 3b: Secondary | ICD-10-CM | POA: Diagnosis not present

## 2023-07-18 DIAGNOSIS — E559 Vitamin D deficiency, unspecified: Secondary | ICD-10-CM | POA: Diagnosis not present

## 2023-07-18 DIAGNOSIS — R7309 Other abnormal glucose: Secondary | ICD-10-CM | POA: Diagnosis not present

## 2023-12-20 DIAGNOSIS — Z23 Encounter for immunization: Secondary | ICD-10-CM | POA: Diagnosis not present

## 2024-01-08 DIAGNOSIS — E7800 Pure hypercholesterolemia, unspecified: Secondary | ICD-10-CM | POA: Diagnosis not present

## 2024-01-08 DIAGNOSIS — E559 Vitamin D deficiency, unspecified: Secondary | ICD-10-CM | POA: Diagnosis not present

## 2024-01-08 DIAGNOSIS — N1832 Chronic kidney disease, stage 3b: Secondary | ICD-10-CM | POA: Diagnosis not present

## 2024-01-08 DIAGNOSIS — I129 Hypertensive chronic kidney disease with stage 1 through stage 4 chronic kidney disease, or unspecified chronic kidney disease: Secondary | ICD-10-CM | POA: Diagnosis not present

## 2024-01-08 DIAGNOSIS — R7309 Other abnormal glucose: Secondary | ICD-10-CM | POA: Diagnosis not present

## 2024-01-08 DIAGNOSIS — E039 Hypothyroidism, unspecified: Secondary | ICD-10-CM | POA: Diagnosis not present

## 2024-01-15 DIAGNOSIS — M810 Age-related osteoporosis without current pathological fracture: Secondary | ICD-10-CM | POA: Diagnosis not present

## 2024-01-15 DIAGNOSIS — L989 Disorder of the skin and subcutaneous tissue, unspecified: Secondary | ICD-10-CM | POA: Diagnosis not present

## 2024-01-15 DIAGNOSIS — E559 Vitamin D deficiency, unspecified: Secondary | ICD-10-CM | POA: Diagnosis not present

## 2024-01-15 DIAGNOSIS — E7800 Pure hypercholesterolemia, unspecified: Secondary | ICD-10-CM | POA: Diagnosis not present

## 2024-01-15 DIAGNOSIS — R8290 Unspecified abnormal findings in urine: Secondary | ICD-10-CM | POA: Diagnosis not present

## 2024-01-15 DIAGNOSIS — Z0000 Encounter for general adult medical examination without abnormal findings: Secondary | ICD-10-CM | POA: Diagnosis not present

## 2024-01-15 DIAGNOSIS — N1832 Chronic kidney disease, stage 3b: Secondary | ICD-10-CM | POA: Diagnosis not present

## 2024-01-15 DIAGNOSIS — M170 Bilateral primary osteoarthritis of knee: Secondary | ICD-10-CM | POA: Diagnosis not present

## 2024-01-15 DIAGNOSIS — E039 Hypothyroidism, unspecified: Secondary | ICD-10-CM | POA: Diagnosis not present
